# Patient Record
Sex: Male | Born: 1955 | Race: Black or African American | Hispanic: No | Marital: Married | Smoking: Never smoker
Health system: Southern US, Community
[De-identification: ages and names within clinical notes are randomized; demographics above are authoritative.]

## PROBLEM LIST (undated history)

## (undated) DIAGNOSIS — M722 Plantar fascial fibromatosis: Secondary | ICD-10-CM

## (undated) DIAGNOSIS — D649 Anemia, unspecified: Secondary | ICD-10-CM

## (undated) DIAGNOSIS — C801 Malignant (primary) neoplasm, unspecified: Secondary | ICD-10-CM

## (undated) DIAGNOSIS — N400 Enlarged prostate without lower urinary tract symptoms: Secondary | ICD-10-CM

## (undated) DIAGNOSIS — F329 Major depressive disorder, single episode, unspecified: Secondary | ICD-10-CM

## (undated) DIAGNOSIS — K219 Gastro-esophageal reflux disease without esophagitis: Secondary | ICD-10-CM

## (undated) DIAGNOSIS — T7840XA Allergy, unspecified, initial encounter: Secondary | ICD-10-CM

## (undated) DIAGNOSIS — M549 Dorsalgia, unspecified: Secondary | ICD-10-CM

## (undated) DIAGNOSIS — F32A Depression, unspecified: Secondary | ICD-10-CM

## (undated) DIAGNOSIS — R51 Headache: Secondary | ICD-10-CM

## (undated) DIAGNOSIS — M1990 Unspecified osteoarthritis, unspecified site: Secondary | ICD-10-CM

## (undated) DIAGNOSIS — R519 Headache, unspecified: Secondary | ICD-10-CM

## (undated) DIAGNOSIS — I10 Essential (primary) hypertension: Secondary | ICD-10-CM

## (undated) DIAGNOSIS — D72819 Decreased white blood cell count, unspecified: Secondary | ICD-10-CM

## (undated) DIAGNOSIS — D709 Neutropenia, unspecified: Secondary | ICD-10-CM

## (undated) HISTORY — PX: EYE SURGERY: SHX253

## (undated) HISTORY — DX: Benign prostatic hyperplasia without lower urinary tract symptoms: N40.0

## (undated) HISTORY — PX: HERNIA REPAIR: SHX51

## (undated) HISTORY — PX: FRACTURE SURGERY: SHX138

## (undated) HISTORY — PX: WRIST FRACTURE SURGERY: SHX121

## (undated) HISTORY — PX: INGUINAL EXPLORATION: SHX1826

## (undated) HISTORY — DX: Major depressive disorder, single episode, unspecified: F32.9

## (undated) HISTORY — DX: Depression, unspecified: F32.A

## (undated) HISTORY — DX: Neutropenia, unspecified: D70.9

## (undated) HISTORY — DX: Allergy, unspecified, initial encounter: T78.40XA

## (undated) HISTORY — DX: Essential (primary) hypertension: I10

## (undated) HISTORY — PX: KNEE SURGERY: SHX244

## (undated) HISTORY — DX: Gastro-esophageal reflux disease without esophagitis: K21.9

## (undated) HISTORY — DX: Decreased white blood cell count, unspecified: D72.819

---

## 1998-08-12 ENCOUNTER — Ambulatory Visit (HOSPITAL_BASED_OUTPATIENT_CLINIC_OR_DEPARTMENT_OTHER): Admission: RE | Admit: 1998-08-12 | Discharge: 1998-08-12 | Payer: Self-pay | Admitting: General Surgery

## 2005-03-29 ENCOUNTER — Encounter (INDEPENDENT_AMBULATORY_CARE_PROVIDER_SITE_OTHER): Payer: Self-pay | Admitting: Specialist

## 2005-03-29 ENCOUNTER — Ambulatory Visit (HOSPITAL_COMMUNITY): Admission: RE | Admit: 2005-03-29 | Discharge: 2005-03-29 | Payer: Self-pay | Admitting: Gastroenterology

## 2010-07-08 ENCOUNTER — Encounter
Admission: RE | Admit: 2010-07-08 | Discharge: 2010-07-08 | Payer: Self-pay | Source: Home / Self Care | Admitting: Orthopedic Surgery

## 2010-10-25 ENCOUNTER — Other Ambulatory Visit: Payer: Self-pay | Admitting: Orthopedic Surgery

## 2010-10-25 DIAGNOSIS — M25531 Pain in right wrist: Secondary | ICD-10-CM

## 2010-10-27 ENCOUNTER — Ambulatory Visit
Admission: RE | Admit: 2010-10-27 | Discharge: 2010-10-27 | Disposition: A | Discharge status: Home / Self Care | Payer: Managed Care, Other (non HMO) | Source: Ambulatory Visit | Attending: Orthopedic Surgery | Admitting: Orthopedic Surgery

## 2010-10-27 DIAGNOSIS — M25531 Pain in right wrist: Secondary | ICD-10-CM

## 2011-01-07 NOTE — Op Note (Signed)
NAME:  Brent Massey, Brent Massey NO.:  [id number]   MEDICAL RECORD NO.:  [id number]          PATIENT TYPE:  AMB   LOCATION:  ENDO                         FACILITY:  [facility]   PHYSICIAN:  Brent Massey, M.D.   DATE OF BIRTH:  Dec 24, 1955   DATE OF PROCEDURE:  03/29/2005  DATE OF DISCHARGE:                                 OPERATIVE REPORT   PROCEDURE:  Esophagogastroduodenoscopy, small bowel biopsy and colonoscopy.   PROCEDURE INDICATIONS:  Mr. Brent Massey is a 55 year old male born March 09, 1956. Mr. Brent Massey has unexplained iron deficiency anemia based on a  hemoglobin 10.1 grams, MCV 67, serum iron saturation 5% and normal serum B12  level.   Mr. Brent Massey mother has undergone colonoscopic exams in the past to remove  colon polyps. There is no family history of colon cancer. He denies  indigestion, heartburn or gastrointestinal bleeding.   For approximately four years,  Mr. Brent Massey has donated blood to the [employer] every eight weeks. He the has been rejected as a donor at least  once in the past due to low blood counts.   MEDICATIONS ALLERGIES:  None.   CHRONIC MEDICATIONS:  Flonase nasal spray, multivitamin, Saw Palmetto,  multivitamin supplements.   PAST MEDICAL - SURGICAL HISTORY:  Seasonal rhinitis, mildly elevated blood  pressure, benign prostatic hypertrophy, bilateral inguinal hernia repair,  wisdom teeth extraction.   FAMILY HISTORY:  Mother has had colon polyps. No family history of colon  cancer.   HABITS:  Mr. Brent Massey does not smoke cigarettes or consume alcohol.   ENDOSCOPIST:  Brent Massey, M.D.   PREMEDICATION:  Versed 7.5 mg, Demerol 70 mg.   DESCRIPTION OF PROCEDURE:  Esophagogastroduodenoscopy with small-bowel  biopsy. After obtaining informed consent, Mr. Brent Massey was placed in the left  lateral decubitus position. I administered intravenous Demerol and  intravenous Versed to achieve conscious sedation for the procedure. The  patient's blood pressure, oxygen saturation and cardiac rhythm were  monitored throughout the procedure and documented in the medical record.   The Olympus gastroscope was passed through the posterior hypopharynx into  the proximal esophagus without difficulty. The hypopharynx, larynx and vocal  cords appeared normal.   ESOPHAGOSCOPY:  The proximal, mid and lower segments of the esophageal  mucosa appear completely normal.   GASTROSCOPY:  Retroflex view of the gastric cardia and fundus was normal.  The gastric body, antrum and pylorus appeared normal.   DUODENOSCOPY:  The duodenal bulb, second portion of duodenum and third  portion of duodenum appeared normal.   BIOPSIES:  Four biopsies were taken from the second portion and third  portion of the duodenum to look for celiac sprue.   ASSESSMENT:  Normal esophagogastroduodenoscopy. Small bowel biopsies rule  out sprue pending.   PROCEDURE:  Proctocolonoscopy to the cecum.   DESCRIPTION OF PROCEDURE:  Anal inspection and digital rectal exam were  normal. The prostate was nonnodular. The Olympus adjustable pediatric  colonoscope was introduced into the rectum and advanced to the cecum. A  normal-appearing appendiceal orifice and ileocecal valve were identified.  Colonic preparation for the  exam today was excellent.   RECTUM:  Normal. Retroflex view of the distal rectum normal.  SIGMOID COLON AND DESCENDING COLON:  Normal.  SPLENIC FLEXURE:  Normal.  TRANSVERSE COLON:  Normal.  HEPATIC FLEXURE:  Normal.  ASCENDING COLON:  Normal.  CECUM AND ILEOCECAL VALVE:  Normal.   ASSESSMENT:  Normal screening proctocolonoscopy to the cecum. No endoscopic  evidence for the presence of colorectal neoplasia.   RECOMMENDATIONS:  Repeat colonoscopy in five years based on the fact that  his mother has had colon polyps removed in the past.       MJ/MEDQ  D:  03/29/2005  T:  03/29/2005  Job:  [id number]   cc:   Brent Housekeeper, MD  [address]  [place]  [place] [address]  Fax: [phone]

## 2011-06-03 ENCOUNTER — Encounter: Payer: Managed Care, Other (non HMO) | Admitting: Oncology

## 2011-06-10 ENCOUNTER — Ambulatory Visit (HOSPITAL_BASED_OUTPATIENT_CLINIC_OR_DEPARTMENT_OTHER): Payer: BC Managed Care – PPO | Admitting: Oncology

## 2011-06-10 ENCOUNTER — Other Ambulatory Visit: Payer: Self-pay | Admitting: Oncology

## 2011-06-10 DIAGNOSIS — D72819 Decreased white blood cell count, unspecified: Secondary | ICD-10-CM

## 2011-06-10 DIAGNOSIS — D709 Neutropenia, unspecified: Secondary | ICD-10-CM

## 2011-06-10 DIAGNOSIS — I10 Essential (primary) hypertension: Secondary | ICD-10-CM

## 2011-06-10 DIAGNOSIS — F329 Major depressive disorder, single episode, unspecified: Secondary | ICD-10-CM

## 2011-06-10 LAB — CBC WITH DIFFERENTIAL/PLATELET
BASO%: 0.7 % (ref 0.0–2.0)
Basophils Absolute: 0 10*3/uL (ref 0.0–0.1)
EOS%: 5.3 % (ref 0.0–7.0)
HCT: 39.2 % (ref 38.4–49.9)
LYMPH%: 46 % (ref 14.0–49.0)
MCH: 31.6 pg (ref 27.2–33.4)
MCHC: 33.4 g/dL (ref 32.0–36.0)
MCV: 94.5 fL (ref 79.3–98.0)
MONO%: 18.7 % — ABNORMAL HIGH (ref 0.0–14.0)
NEUT%: 29.3 % — ABNORMAL LOW (ref 39.0–75.0)
Platelets: 170 10*3/uL (ref 140–400)
lymph#: 0.7 10*3/uL — ABNORMAL LOW (ref 0.9–3.3)

## 2011-06-10 LAB — MORPHOLOGY: PLT EST: ADEQUATE

## 2011-06-10 LAB — CHCC SMEAR

## 2011-06-13 ENCOUNTER — Other Ambulatory Visit: Payer: Self-pay | Admitting: Oncology

## 2011-06-13 DIAGNOSIS — D72819 Decreased white blood cell count, unspecified: Secondary | ICD-10-CM

## 2011-06-13 LAB — COMPREHENSIVE METABOLIC PANEL
AST: 25 U/L (ref 0–37)
Albumin: 4 g/dL (ref 3.5–5.2)
Alkaline Phosphatase: 60 U/L (ref 39–117)
Potassium: 3.9 mEq/L (ref 3.5–5.3)
Sodium: 138 mEq/L (ref 135–145)
Total Bilirubin: 0.7 mg/dL (ref 0.3–1.2)
Total Protein: 6.8 g/dL (ref 6.0–8.3)

## 2011-06-13 LAB — URIC ACID: Uric Acid, Serum: 5.3 mg/dL (ref 4.0–7.8)

## 2011-06-20 ENCOUNTER — Other Ambulatory Visit: Payer: Self-pay | Admitting: Oncology

## 2011-06-20 ENCOUNTER — Other Ambulatory Visit: Payer: Self-pay | Admitting: Diagnostic Radiology

## 2011-06-20 ENCOUNTER — Ambulatory Visit (HOSPITAL_COMMUNITY): Payer: BC Managed Care – PPO

## 2011-06-20 ENCOUNTER — Ambulatory Visit (HOSPITAL_COMMUNITY)
Admission: RE | Admit: 2011-06-20 | Discharge: 2011-06-20 | Disposition: A | Discharge status: Home / Self Care | Payer: BC Managed Care – PPO | Source: Ambulatory Visit | Attending: Oncology | Admitting: Oncology

## 2011-06-20 DIAGNOSIS — F3289 Other specified depressive episodes: Secondary | ICD-10-CM | POA: Insufficient documentation

## 2011-06-20 DIAGNOSIS — D72819 Decreased white blood cell count, unspecified: Secondary | ICD-10-CM

## 2011-06-20 DIAGNOSIS — A6000 Herpesviral infection of urogenital system, unspecified: Secondary | ICD-10-CM | POA: Insufficient documentation

## 2011-06-20 DIAGNOSIS — F329 Major depressive disorder, single episode, unspecified: Secondary | ICD-10-CM | POA: Insufficient documentation

## 2011-06-20 DIAGNOSIS — K219 Gastro-esophageal reflux disease without esophagitis: Secondary | ICD-10-CM | POA: Insufficient documentation

## 2011-06-20 DIAGNOSIS — D61818 Other pancytopenia: Secondary | ICD-10-CM | POA: Insufficient documentation

## 2011-06-20 DIAGNOSIS — I10 Essential (primary) hypertension: Secondary | ICD-10-CM | POA: Insufficient documentation

## 2011-06-20 LAB — CBC
Hemoglobin: 13 g/dL (ref 13.0–17.0)
MCH: 32.7 pg (ref 26.0–34.0)
MCV: 96.2 fL (ref 78.0–100.0)
Platelets: 157 10*3/uL (ref 150–400)
RBC: 3.98 MIL/uL — ABNORMAL LOW (ref 4.22–5.81)
WBC: 1.6 10*3/uL — ABNORMAL LOW (ref 4.0–10.5)

## 2011-06-20 LAB — APTT: aPTT: 30 seconds (ref 24–37)

## 2011-06-20 LAB — PROTIME-INR: Prothrombin Time: 14.1 seconds (ref 11.6–15.2)

## 2011-06-20 LAB — BONE MARROW EXAM: Bone Marrow Exam: 817

## 2011-06-29 LAB — CHROMOSOME ANALYSIS, BONE MARROW

## 2011-07-05 ENCOUNTER — Encounter: Payer: Self-pay | Admitting: Oncology

## 2011-07-05 DIAGNOSIS — D709 Neutropenia, unspecified: Secondary | ICD-10-CM | POA: Insufficient documentation

## 2011-07-05 DIAGNOSIS — N400 Enlarged prostate without lower urinary tract symptoms: Secondary | ICD-10-CM | POA: Insufficient documentation

## 2011-07-05 DIAGNOSIS — F329 Major depressive disorder, single episode, unspecified: Secondary | ICD-10-CM | POA: Insufficient documentation

## 2011-07-05 DIAGNOSIS — F32A Depression, unspecified: Secondary | ICD-10-CM | POA: Insufficient documentation

## 2011-07-05 DIAGNOSIS — D72819 Decreased white blood cell count, unspecified: Secondary | ICD-10-CM | POA: Insufficient documentation

## 2011-07-05 DIAGNOSIS — K219 Gastro-esophageal reflux disease without esophagitis: Secondary | ICD-10-CM | POA: Insufficient documentation

## 2011-07-05 DIAGNOSIS — I10 Essential (primary) hypertension: Secondary | ICD-10-CM | POA: Insufficient documentation

## 2011-07-09 ENCOUNTER — Other Ambulatory Visit: Payer: Self-pay | Admitting: Oncology

## 2011-07-12 ENCOUNTER — Encounter: Payer: Self-pay | Admitting: *Deleted

## 2011-07-18 ENCOUNTER — Other Ambulatory Visit: Payer: Self-pay | Admitting: Oncology

## 2011-07-18 ENCOUNTER — Other Ambulatory Visit (HOSPITAL_BASED_OUTPATIENT_CLINIC_OR_DEPARTMENT_OTHER): Payer: BC Managed Care – PPO | Admitting: Lab

## 2011-07-18 ENCOUNTER — Ambulatory Visit (HOSPITAL_BASED_OUTPATIENT_CLINIC_OR_DEPARTMENT_OTHER): Payer: BC Managed Care – PPO | Admitting: Oncology

## 2011-07-18 ENCOUNTER — Other Ambulatory Visit: Payer: Self-pay | Admitting: *Deleted

## 2011-07-18 DIAGNOSIS — I10 Essential (primary) hypertension: Secondary | ICD-10-CM

## 2011-07-18 DIAGNOSIS — D72819 Decreased white blood cell count, unspecified: Secondary | ICD-10-CM

## 2011-07-18 DIAGNOSIS — K219 Gastro-esophageal reflux disease without esophagitis: Secondary | ICD-10-CM

## 2011-07-18 DIAGNOSIS — F329 Major depressive disorder, single episode, unspecified: Secondary | ICD-10-CM

## 2011-07-18 DIAGNOSIS — Z23 Encounter for immunization: Secondary | ICD-10-CM

## 2011-07-18 DIAGNOSIS — D709 Neutropenia, unspecified: Secondary | ICD-10-CM

## 2011-07-18 DIAGNOSIS — N400 Enlarged prostate without lower urinary tract symptoms: Secondary | ICD-10-CM

## 2011-07-18 DIAGNOSIS — F32A Depression, unspecified: Secondary | ICD-10-CM

## 2011-07-18 LAB — COMPREHENSIVE METABOLIC PANEL
Albumin: 4 g/dL (ref 3.5–5.2)
BUN: 15 mg/dL (ref 6–23)
CO2: 28 mEq/L (ref 19–32)
Calcium: 9.4 mg/dL (ref 8.4–10.5)
Chloride: 105 mEq/L (ref 96–112)
Glucose, Bld: 71 mg/dL (ref 70–99)
Potassium: 4.1 mEq/L (ref 3.5–5.3)
Sodium: 141 mEq/L (ref 135–145)
Total Protein: 6.5 g/dL (ref 6.0–8.3)

## 2011-07-18 LAB — CBC WITH DIFFERENTIAL/PLATELET
BASO%: 1.3 % (ref 0.0–2.0)
Eosinophils Absolute: 0.1 10*3/uL (ref 0.0–0.5)
HCT: 40.3 % (ref 38.4–49.9)
LYMPH%: 49 % (ref 14.0–49.0)
MCHC: 34 g/dL (ref 32.0–36.0)
MCV: 93.9 fL (ref 79.3–98.0)
MONO%: 17.4 % — ABNORMAL HIGH (ref 0.0–14.0)
NEUT%: 25.6 % — ABNORMAL LOW (ref 39.0–75.0)
Platelets: 170 10*3/uL (ref 140–400)
RBC: 4.29 10*6/uL (ref 4.20–5.82)
nRBC: 0 % (ref 0–0)

## 2011-07-18 LAB — MORPHOLOGY

## 2011-07-18 MED ORDER — INFLUENZA VIRUS VACC SPLIT PF IM SUSP
0.5000 mL | INTRAMUSCULAR | Status: AC | PRN
  Administered 2011-07-18: 0.5 mL via INTRAMUSCULAR

## 2011-07-18 NOTE — Progress Notes (Signed)
[facility] OFFICE PROGRESS NOTE  Brent,KARRAR, MD  DIAGNOSIS:  Leukocytopenia, neutropenia; not otherwise specified. Bone marrow biopsy on 06/22/2011 was negative including cytogenetics.  CURRENT THERAPY:  Watchful observation.  INTERVAL HISTORY: Brent Massey 55 y.o. male returns to clinic with his wife to go over the result of work up.  He still has no symptoms.  He still works full time.  He occasionally has some headache that is spontaneously resolved without any medication.  He denies confusion, visual changes, performance at works, anorexia, weight loss, focal motor weakness, problem driving, nausea vomiting, dysphagia, odynophagia, palpable lymph node swelling, chest pain, cough, hemoptysis, hematemesis, epistaxis, gum bleed, palpitation, abdominal pain, abdominal swelling, melena, hematochezia, hematuria, skin rash, low back pain, bowel or bladder incontinence.  MEDICAL HISTORY: Past Medical History  Diagnosis Date  . Leukocytopenia   . Depression   . Hypertension   . BPH (benign prostatic hyperplasia)   . GERD (gastroesophageal reflux disease)   . Neutropenia, unspecified     SURGICAL HISTORY: No past surgical history on file.  MEDICATIONS: Current Outpatient Prescriptions  Medication Sig Dispense Refill  . escitalopram (LEXAPRO) 10 MG tablet Take 10 mg by mouth daily.        . fish oil-omega-3 fatty acids 1000 MG capsule Take 1 g by mouth daily.        Marland Kitchen loratadine (CLARITIN) 10 MG tablet Take 10 mg by mouth daily as needed.       Marland Kitchen losartan-hydrochlorothiazide (HYZAAR) 100-25 MG per tablet Take 1 tablet by mouth daily.        . mometasone (NASONEX) 50 MCG/ACT nasal spray Place 2 sprays into the nose daily.        . Multiple Vitamin (MULTIVITAMIN) tablet Take 1 tablet by mouth daily.        . Probiotic Product (PROBIOTIC FORMULA) CAPS Take 1 capsule by mouth daily. 3.4 Billion       . Saw Palmetto, Serenoa repens, (SAW PALMETTO PO) Take by mouth daily.          No current facility-administered medications for this visit.   Facility-Administered Medications Ordered in Other Visits  Medication Dose Route Frequency Provider Last Rate Last Dose  . influenza  inactive virus vaccine (FLUZONE/FLUARIX) injection 0.5 mL  0.5 mL Intramuscular Prior to discharge Jethro Bolus, MD   0.5 mL at 07/18/11 1348    ALLERGIES:   has no known allergies.  REVIEW OF SYSTEMS:  The rest of the 14-point review of system was negative.   Filed Vitals:   07/18/11 1317  BP: 127/73  Pulse: 61  Temp: 97 F (36.1 C)   Wt Readings from Last 3 Encounters:  07/18/11 189 lb 12.8 oz (86.093 kg)  06/10/11 187 lb 6.4 oz (85.004 kg)   ECOG Performance status: 0  PHYSICAL EXAMINATION:   General:  well-nourished in no acute distress.  Eyes:  no scleral icterus.  ENT:  There were no oropharyngeal lesions.  Neck was without thyromegaly.  Lymphatics:  Negative cervical, supraclavicular or axillary adenopathy.  Respiratory: lungs were clear bilaterally without wheezing or crackles.  Cardiovascular:  Regular rate and rhythm, S1/S2, without murmur, rub or gallop.  There was no pedal edema.  GI:  abdomen was soft, flat, nontender, nondistended, without organomegaly.  Muscoloskeletal:  no spinal tenderness of palpation of vertebral spine.  Skin exam was without echymosis, petichae.  Neuro exam was nonfocal.  Patient was able to get on and off exam table without assistance.  Gait was normal.  Patient was alerted and oriented.  Attention was good.   Language was appropriate.  Mood was normal without depression.  Speech was not pressured.  Thought content was not tangential.     LABORATORY/RADIOLOGY DATA:  Lab Results  Component Value Date   WBC 1.5* 07/18/2011   HGB 13.7 07/18/2011   HCT 40.3 07/18/2011   PLT 170 07/18/2011   GLUCOSE 71 07/18/2011   ALT 21 07/18/2011   AST 27 07/18/2011   NA 141 07/18/2011   K 4.1 07/18/2011   CL 105 07/18/2011   CREATININE 1.07 07/18/2011   BUN 15  07/18/2011   CO2 28 07/18/2011   INR 1.07 06/20/2011    I personally reviewed the patient's peripheral blood smear today.  There was isocytosis.  There was no peripheral blast.  There was no schistocytosis, spherocytosis, target cell, rouleaux formation, tear drop cell.  There was no giant platelets or platelet clumps.     ASSESSMENT AND PLAN:   1.  Leukocytopenia/Neutropenia, NOS:  I discussed with Brent Massey and his wife that they be biopsy showed he had normal cellularity and trilineage hematopoiesis. His cytogenetics was normal.  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  I discussed with him that he does not have recurrent infection and therefore Neulasta is not indicated.  I advised him to watch out for recurrent infection symptoms and if that is the case, I may see him again to start Neulasta to decrease her risk of recurrent infection.  For now watchful observation is recommended.  The patient and his wife expressed informed understanding and agreed with the stated plan.  I cannot rule out the possibility of Lexapro inducing cytopenia; however, medications are not known to cause this level of cytopenia.  2.  Hypertension: Well controlled on losartan/HCTZ per PCP.  3.  Depression:  well-controlled on citalopram for many years per PCP.   4.  Follow up:  Lab only in 2 months, and in 4 months.  I will see him in 6 months.   5.  Primary care: I discussed with him and his wife the risk and benefit of influenza vaccination. He expressed informed understanding and wished to proceed with that today.

## 2011-09-16 ENCOUNTER — Telehealth: Payer: Self-pay | Admitting: *Deleted

## 2011-09-16 ENCOUNTER — Other Ambulatory Visit (HOSPITAL_BASED_OUTPATIENT_CLINIC_OR_DEPARTMENT_OTHER): Payer: BC Managed Care – PPO

## 2011-09-16 DIAGNOSIS — D72819 Decreased white blood cell count, unspecified: Secondary | ICD-10-CM

## 2011-09-16 LAB — CBC WITH DIFFERENTIAL/PLATELET
Basophils Absolute: 0 10*3/uL (ref 0.0–0.1)
Eosinophils Absolute: 0.2 10*3/uL (ref 0.0–0.5)
HGB: 13.8 g/dL (ref 13.0–17.1)
MCV: 93.3 fL (ref 79.3–98.0)
MONO%: 16 % — ABNORMAL HIGH (ref 0.0–14.0)
NEUT#: 0.4 10*3/uL — CL (ref 1.5–6.5)
RBC: 4.36 10*6/uL (ref 4.20–5.82)
RDW: 13 % (ref 11.0–14.6)
WBC: 1.7 10*3/uL — ABNORMAL LOW (ref 4.0–10.3)
lymph#: 0.9 10*3/uL (ref 0.9–3.3)
nRBC: 0 % (ref 0–0)

## 2011-09-16 NOTE — Telephone Encounter (Signed)
Pt called back for results of his CBC and I relayed his results and Dr. Lodema Pilot message that his WBC and neutrophil count is still low, but stable.  Reviewed precautions w/ pt as far as good handwashing,  Avoiding anyone known to be sick and calling his PCP for any fevers or signs of infection. Pt verbalized understanding and next lab appt in March.

## 2011-09-16 NOTE — Telephone Encounter (Signed)
Called pt's cell # and no answer and no voice mail avail..  Called home # and left VM for pt to return call (need to relay message from Dr. Gaylyn Rong regarding pt's CBC results stable).

## 2011-09-16 NOTE — Telephone Encounter (Signed)
Message copied by Wende Mott on Fri Sep 16, 2011  1:07 PM ------      Message from: Jethro Bolus T      Created: Fri Sep 16, 2011 11:10 AM       Please call pt.  His WBC and neutrophil are still low; however, stable.  Continue observation.  Thanks.

## 2011-11-09 ENCOUNTER — Other Ambulatory Visit: Payer: BC Managed Care – PPO | Admitting: Lab

## 2011-11-11 ENCOUNTER — Other Ambulatory Visit: Payer: BC Managed Care – PPO | Admitting: Lab

## 2011-11-17 ENCOUNTER — Other Ambulatory Visit (HOSPITAL_BASED_OUTPATIENT_CLINIC_OR_DEPARTMENT_OTHER): Payer: BC Managed Care – PPO | Admitting: Lab

## 2011-11-17 ENCOUNTER — Telehealth: Payer: Self-pay | Admitting: *Deleted

## 2011-11-17 DIAGNOSIS — D72819 Decreased white blood cell count, unspecified: Secondary | ICD-10-CM

## 2011-11-17 LAB — CBC WITH DIFFERENTIAL/PLATELET
BASO%: 1 % (ref 0.0–2.0)
Eosinophils Absolute: 0.1 10*3/uL (ref 0.0–0.5)
HCT: 40.8 % (ref 38.4–49.9)
MCHC: 33.6 g/dL (ref 32.0–36.0)
MONO#: 0.3 10*3/uL (ref 0.1–0.9)
NEUT#: 0.8 10*3/uL — ABNORMAL LOW (ref 1.5–6.5)
NEUT%: 40.4 % (ref 39.0–75.0)
Platelets: 165 10*3/uL (ref 140–400)
RBC: 4.32 10*6/uL (ref 4.20–5.82)
WBC: 1.9 10*3/uL — ABNORMAL LOW (ref 4.0–10.3)
lymph#: 0.8 10*3/uL — ABNORMAL LOW (ref 0.9–3.3)
nRBC: 0 % (ref 0–0)

## 2011-11-17 NOTE — Telephone Encounter (Signed)
LEFT MESSAGE FOR A RETURN CALL. 

## 2011-11-17 NOTE — Telephone Encounter (Signed)
Message copied by Arvilla Meres on Thu Nov 17, 2011 12:05 PM ------      Message from: HA, Raliegh Ip T      Created: Thu Nov 17, 2011 11:39 AM       Please call pt.  He still has low WBC and neutrophil; but these are slightly better than before.  We will continue watchful observation.  Thanks.

## 2011-11-17 NOTE — Telephone Encounter (Signed)
PT. RETURNED CALL. INFORMED PT. THAT HIS WBC AND NEUTROPHIL ARE STILL LOW, BUT THESE ARE SLIGHTLY BETTER THAN BEFORE. WE WILL CONTINUE WATCHFUL OBSERVATION. PT. VOICES UNDERSTANDING.

## 2012-01-20 ENCOUNTER — Telehealth: Payer: Self-pay | Admitting: Oncology

## 2012-01-20 ENCOUNTER — Ambulatory Visit (HOSPITAL_BASED_OUTPATIENT_CLINIC_OR_DEPARTMENT_OTHER): Payer: BC Managed Care – PPO | Admitting: Oncology

## 2012-01-20 ENCOUNTER — Ambulatory Visit: Payer: BC Managed Care – PPO | Admitting: Oncology

## 2012-01-20 ENCOUNTER — Encounter: Payer: Self-pay | Admitting: Oncology

## 2012-01-20 ENCOUNTER — Other Ambulatory Visit (HOSPITAL_BASED_OUTPATIENT_CLINIC_OR_DEPARTMENT_OTHER): Payer: BC Managed Care – PPO | Admitting: Lab

## 2012-01-20 ENCOUNTER — Other Ambulatory Visit: Payer: BC Managed Care – PPO | Admitting: Lab

## 2012-01-20 VITALS — BP 128/81 | HR 60 | Temp 97.1°F | Ht 74.0 in | Wt 194.0 lb

## 2012-01-20 DIAGNOSIS — D709 Neutropenia, unspecified: Secondary | ICD-10-CM

## 2012-01-20 DIAGNOSIS — D72819 Decreased white blood cell count, unspecified: Secondary | ICD-10-CM

## 2012-01-20 LAB — CBC WITH DIFFERENTIAL/PLATELET
BASO%: 0.7 % (ref 0.0–2.0)
Basophils Absolute: 0 10*3/uL (ref 0.0–0.1)
Eosinophils Absolute: 0.1 10*3/uL (ref 0.0–0.5)
HCT: 40.8 % (ref 38.4–49.9)
HGB: 13.6 g/dL (ref 13.0–17.1)
LYMPH%: 49.8 % — ABNORMAL HIGH (ref 14.0–49.0)
MONO#: 0.3 10*3/uL (ref 0.1–0.9)
NEUT#: 0.3 10*3/uL — CL (ref 1.5–6.5)
NEUT%: 22.2 % — ABNORMAL LOW (ref 39.0–75.0)
Platelets: 169 10*3/uL (ref 140–400)
WBC: 1.5 10*3/uL — ABNORMAL LOW (ref 4.0–10.3)
lymph#: 0.8 10*3/uL — ABNORMAL LOW (ref 0.9–3.3)

## 2012-01-20 NOTE — Progress Notes (Signed)
[facility] OFFICE PROGRESS NOTE  Brent Housekeeper, MD, MD  DIAGNOSIS:  Leukocytopenia, neutropenia; not otherwise specified. Bone marrow biopsy on 06/22/2011 was negative including cytogenetics.  CURRENT THERAPY:  Watchful observation.  INTERVAL HISTORY: Brent Massey 56 y.o. male returns to clinic by himself for follow-up.  He still has no symptoms.  He still works full time.  He occasionally has some headache that is spontaneously resolved without any medication.  He denies confusion, visual changes, anorexia, weight loss, focal motor weakness, problem driving, nausea vomiting, dysphagia, odynophagia, palpable lymph node swelling, chest pain, cough, hemoptysis, hematemesis, epistaxis, gum bleed, palpitation, abdominal pain, abdominal swelling, melena, hematochezia, hematuria, skin rash, low back pain, bowel or bladder incontinence. No recent infections or fevers.  MEDICAL HISTORY: Past Medical History  Diagnosis Date  . Leukocytopenia   . Depression   . Hypertension   . BPH (benign prostatic hyperplasia)   . GERD (gastroesophageal reflux disease)   . Neutropenia, unspecified     SURGICAL HISTORY: History reviewed. No pertinent past surgical history.  MEDICATIONS: Current Outpatient Prescriptions  Medication Sig Dispense Refill  . escitalopram (LEXAPRO) 10 MG tablet Take 10 mg by mouth daily.        . fish oil-omega-3 fatty acids 1000 MG capsule Take 1 g by mouth daily.        Marland Kitchen loratadine (CLARITIN) 10 MG tablet Take 10 mg by mouth daily as needed.       Marland Kitchen losartan-hydrochlorothiazide (HYZAAR) 100-25 MG per tablet Take 1 tablet by mouth daily.        . mometasone (NASONEX) 50 MCG/ACT nasal spray Place 2 sprays into the nose daily.        . Multiple Vitamin (MULTIVITAMIN) tablet Take 1 tablet by mouth daily.        . Probiotic Product (PROBIOTIC FORMULA) CAPS Take 1 capsule by mouth daily. 3.4 Billion       . Saw Palmetto, Serenoa repens, (SAW PALMETTO PO) Take by mouth  daily.          ALLERGIES:   has no known allergies.  REVIEW OF SYSTEMS:  The rest of the 14-point review of system was negative.   There were no vitals filed for this visit. Wt Readings from Last 3 Encounters:  07/18/11 189 lb 12.8 oz (86.093 kg)  06/10/11 187 lb 6.4 oz (85.004 kg)   ECOG Performance status: 0  PHYSICAL EXAMINATION:   General:  well-nourished in no acute distress.  Eyes:  no scleral icterus.  ENT:  There were no oropharyngeal lesions.  Neck was without thyromegaly.  Lymphatics:  Negative cervical, supraclavicular or axillary adenopathy.  Respiratory: lungs were clear bilaterally without wheezing or crackles.  Cardiovascular:  Regular rate and rhythm, S1/S2, without murmur, rub or gallop.  There was no pedal edema.  GI:  abdomen was soft, flat, nontender, nondistended, without organomegaly.  Muscoloskeletal:  no spinal tenderness of palpation of vertebral spine.  Skin exam was without echymosis, petichae.  Neuro exam was nonfocal.  Patient was able to get on and off exam table without assistance.  Gait was normal.  Patient was alerted and oriented.  Attention was good.   Language was appropriate.  Mood was normal without depression.  Speech was not pressured.  Thought content was not tangential.     LABORATORY/RADIOLOGY DATA:  Lab Results  Component Value Date   WBC 1.5* 01/20/2012   HGB 13.6 01/20/2012   HCT 40.8 01/20/2012   PLT 169 01/20/2012   GLUCOSE 71 07/18/2011  ALT 21 07/18/2011   AST 27 07/18/2011   NA 141 07/18/2011   K 4.1 07/18/2011   CL 105 07/18/2011   CREATININE 1.07 07/18/2011   BUN 15 07/18/2011   CO2 28 07/18/2011   INR 1.07 06/20/2011    ASSESSMENT AND PLAN:   1.  Leukocytopenia/Neutropenia, NOS:  Previous bone marrow biopsy showed he had normal cellularity and trilineage hematopoiesis. His cytogenetics was normal.  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  I discussed with him that he does not have recurrent infection  and therefore Neulasta is not indicated.  I advised him to watch out for recurrent infection symptoms and if that is the case, I may see him again to start Neulasta to decrease her risk of recurrent infection.  For now watchful observation is recommended.  The patient expressed informed understanding and agreed with the stated plan.  I cannot rule out the possibility of Lexapro inducing cytopenia; however, medications are not known to cause this level of cytopenia.  2.  Hypertension: Well controlled on losartan/HCTZ per PCP.  3.  Depression:  well-controlled on Lexapr for many years per PCP.   4.  Follow up:  Lab only in 2 months, and in 4 months. He will have a visit with Dr Gaylyn Rong in 6 months.

## 2012-01-20 NOTE — Telephone Encounter (Signed)
Gv pt appt for [id number] 

## 2012-01-21 LAB — COMPREHENSIVE METABOLIC PANEL
BUN: 14 mg/dL (ref 6–23)
CO2: 30 mEq/L (ref 19–32)
Calcium: 8.7 mg/dL (ref 8.4–10.5)
Chloride: 104 mEq/L (ref 96–112)
Creatinine, Ser: 1.06 mg/dL (ref 0.50–1.35)
Total Bilirubin: 0.6 mg/dL (ref 0.3–1.2)

## 2012-01-25 ENCOUNTER — Ambulatory Visit (INDEPENDENT_AMBULATORY_CARE_PROVIDER_SITE_OTHER): Payer: BC Managed Care – PPO | Admitting: Physician Assistant

## 2012-01-25 ENCOUNTER — Encounter: Payer: Self-pay | Admitting: Physician Assistant

## 2012-01-25 VITALS — BP 116/72 | HR 57 | Temp 97.8°F | Resp 16 | Ht 75.0 in | Wt 194.0 lb

## 2012-01-25 DIAGNOSIS — L255 Unspecified contact dermatitis due to plants, except food: Secondary | ICD-10-CM

## 2012-01-25 DIAGNOSIS — L299 Pruritus, unspecified: Secondary | ICD-10-CM

## 2012-01-25 DIAGNOSIS — L237 Allergic contact dermatitis due to plants, except food: Secondary | ICD-10-CM

## 2012-01-25 MED ORDER — TRIAMCINOLONE ACETONIDE 0.1 % EX CREA: TOPICAL_CREAM | CUTANEOUS | Status: DC

## 2012-01-25 MED ORDER — PREDNISONE 20 MG PO TABS: ORAL_TABLET | ORAL | Status: AC

## 2012-01-25 NOTE — Progress Notes (Signed)
Patient ID: Brent Massey MRN: [id number], DOB: [birth year], 56 y.o. Date of Encounter: 01/25/2012, 5:47 PM  Primary Physician: Georgann Housekeeper, MD, MD  Chief Complaint: Pruritic rash  HPI: 56 y.o. year old male with presents with a five day history of mildly erythematous pruritic rash along right forearm. Patient was doing yard work prior to the development of the rash. Known poison ivy in the vicinity. Has not yet washed all clothing or linens that have been exposed. Has tried OTC creams without success. Currently under evaluation for leukopenia of uncertain etiology. Patient is otherwise doing well without issues or complaints.  Past Medical History  Diagnosis Date  . Leukocytopenia   . Depression   . Hypertension   . BPH (benign prostatic hyperplasia)   . GERD (gastroesophageal reflux disease)   . Neutropenia, unspecified      Home Meds: Prior to Admission medications   Medication Sig Start Date End Date Taking? Authorizing Provider  escitalopram (LEXAPRO) 10 MG tablet Take 10 mg by mouth daily.      Historical Provider, MD  fish oil-omega-3 fatty acids 1000 MG capsule Take 1 g by mouth daily.      Historical Provider, MD  loratadine (CLARITIN) 10 MG tablet Take 10 mg by mouth daily as needed.     Historical Provider, MD  losartan-hydrochlorothiazide (HYZAAR) 100-25 MG per tablet Take 1 tablet by mouth daily.      Historical Provider, MD  mometasone (NASONEX) 50 MCG/ACT nasal spray Place 2 sprays into the nose daily.      Historical Provider, MD  Multiple Vitamin (MULTIVITAMIN) tablet Take 1 tablet by mouth daily.      Historical Provider, MD         Probiotic Product (PROBIOTIC FORMULA) CAPS Take 1 capsule by mouth daily. 3.4 Billion     Historical Provider, MD  Saw Palmetto, Serenoa repens, (SAW PALMETTO PO) Take by mouth daily.      Historical Provider, MD           Allergies: No Known Allergies  History   Social History  . Marital Status: Married    Spouse Name: N/A   Number of Children: N/A  . Years of Education: N/A   Occupational History  . Not on file.   Social History Main Topics  . Smoking status: Never Smoker   . Smokeless tobacco: Not on file  . Alcohol Use: Not on file  . Drug Use: Not on file  . Sexually Active: Not on file   Other Topics Concern  . Not on file   Social History Narrative  . No narrative on file     Review of Systems: Constitutional: negative for chills, fever, night sweats, weight changes, or fatigue  HEENT: negative for vision changes, hearing loss, congestion, rhinorrhea, ST, epistaxis, or sinus pressure Cardiovascular: negative for chest pain or palpitations Respiratory: negative for hemoptysis, wheezing, shortness of breath, or cough Abdominal: negative for abdominal pain, nausea, vomiting, diarrhea, or constipation Dermatological: see above Neurologic: negative for headache, dizziness, or syncope   Physical Exam: Blood pressure 116/72, pulse 57, temperature 97.8 F (36.6 C), temperature source Oral, resp. rate 16, height 6\' 3"  (1.905 m), weight 194 lb (87.998 kg), SpO2 99.00%., Body mass index is 24.25 kg/(m^2). General: Well developed, well nourished, in no acute distress. Head: Normocephalic, atraumatic, eyes without discharge, sclera non-icteric, nares are without discharge.  Neck: Supple. No thyromegaly. Full ROM. No lymphadenopathy. Lungs: Clear bilaterally to auscultation without wheezes, rales, or rhonchi. Breathing is unlabored.  Heart: RRR with S1 S2. No murmurs, rubs, or gallops appreciated. Msk:  Strength and tone normal for age. Extremities/Skin: Warm and dry. Multiple vesicular lesions along right forearm consistent with poison ivy. No secondary infections present. No clubbing or cyanosis. No edema.  Neuro: Alert and oriented X 3. Moves all extremities spontaneously. Gait is normal. CNII-XII grossly in tact. Psych:  Responds to questions appropriately with a normal affect.      ASSESSMENT  AND PLAN:  56 y.o. year old male with poison ivy -Prednisone 20 mg #12 3x2, 2x2, 1x2 no RF -Triamcinolone topical cream 0.1% Apply to affected area bid #45 grams RF 4 -Attempt to avoid scratching to decrease possibility of secondary infection and leukopenia -Discussed with Dr. Milus Glazier [facility] all contaminated clothes and linens -RTC 10 days if symptoms persist, sooner if they worsen   Signed, Eula Listen, PA-C 01/25/2012 5:47 PM

## 2012-03-21 ENCOUNTER — Other Ambulatory Visit (HOSPITAL_BASED_OUTPATIENT_CLINIC_OR_DEPARTMENT_OTHER): Payer: BC Managed Care – PPO | Admitting: Lab

## 2012-03-21 DIAGNOSIS — D709 Neutropenia, unspecified: Secondary | ICD-10-CM

## 2012-03-21 DIAGNOSIS — D72819 Decreased white blood cell count, unspecified: Secondary | ICD-10-CM

## 2012-03-21 LAB — CBC WITH DIFFERENTIAL/PLATELET
Basophils Absolute: 0 10*3/uL (ref 0.0–0.1)
Eosinophils Absolute: 0.2 10*3/uL (ref 0.0–0.5)
HCT: 43 % (ref 38.4–49.9)
HGB: 14.3 g/dL (ref 13.0–17.1)
MCV: 96.3 fL (ref 79.3–98.0)
MONO%: 14.7 % — ABNORMAL HIGH (ref 0.0–14.0)
NEUT#: 0.6 10*3/uL — ABNORMAL LOW (ref 1.5–6.5)
NEUT%: 31 % — ABNORMAL LOW (ref 39.0–75.0)
Platelets: 164 10*3/uL (ref 140–400)
RDW: 13 % (ref 11.0–14.6)

## 2012-03-26 ENCOUNTER — Telehealth: Payer: Self-pay | Admitting: *Deleted

## 2012-03-26 NOTE — Telephone Encounter (Signed)
Called pt w/ results of CBC, WBC and Neuts stable per Dr. Gaylyn Rong.  Pt denies any fevers or s/s of infections.  Instructed to continue observation,  Call if any recurrent infections..  Pt verbalized understanding.

## 2012-03-26 NOTE — Telephone Encounter (Signed)
Message copied by Wende Mott on Mon Mar 26, 2012 11:43 AM ------      Message from: Clenton Pare R      Created: Mon Mar 26, 2012  8:56 AM       Please call patient and let him know that his CBC remains stable. Continue follow-up as scheduled with [place].            Thanks

## 2012-04-02 ENCOUNTER — Encounter (HOSPITAL_COMMUNITY): Payer: Self-pay | Admitting: Emergency Medicine

## 2012-04-02 ENCOUNTER — Emergency Department (HOSPITAL_COMMUNITY)
Admission: EM | Admit: 2012-04-02 | Discharge: 2012-04-02 | Disposition: A | Discharge status: Home / Self Care | Payer: BC Managed Care – PPO | Attending: Emergency Medicine | Admitting: Emergency Medicine

## 2012-04-02 DIAGNOSIS — K219 Gastro-esophageal reflux disease without esophagitis: Secondary | ICD-10-CM | POA: Insufficient documentation

## 2012-04-02 DIAGNOSIS — I10 Essential (primary) hypertension: Secondary | ICD-10-CM | POA: Insufficient documentation

## 2012-04-02 DIAGNOSIS — R109 Unspecified abdominal pain: Secondary | ICD-10-CM | POA: Insufficient documentation

## 2012-04-02 DIAGNOSIS — R197 Diarrhea, unspecified: Secondary | ICD-10-CM | POA: Insufficient documentation

## 2012-04-02 DIAGNOSIS — N400 Enlarged prostate without lower urinary tract symptoms: Secondary | ICD-10-CM | POA: Insufficient documentation

## 2012-04-02 NOTE — ED Notes (Signed)
Received pt by EMS with c/o mid abdominal pain with 1 episode of diarrhea with diaphoresis. Pt denies pain at present. Pt describes pain as muscle cramping. Abdomen soft, non tender. Pt denies N/V.

## 2012-04-02 NOTE — ED Provider Notes (Signed)
History  This chart was scribed for Suzi Roots, MD by Bennett Scrape. This patient was seen in room A10C/A10C and the patient's care was started at 8:27AM.  CSN: [id number]  Arrival date & time 04/02/12  2952   First MD Initiated Contact with Patient 04/02/12 0827      Chief Complaint  Patient presents with  . Abdominal Pain  . Diarrhea    Patient is a 56 y.o. male presenting with abdominal pain. The history is provided by the patient. No language interpreter was used.  Abdominal Pain The primary symptoms of the illness include abdominal pain and diarrhea. The primary symptoms of the illness do not include fever, shortness of breath, nausea or vomiting. The current episode started less than 1 hour ago.  The abdominal pain is located in the epigastric region. The abdominal pain does not radiate.  Additional symptoms associated with the illness include diaphoresis. Symptoms associated with the illness do not include chills. Significant associated medical issues include GERD. Significant associated medical issues do not include diabetes or gallstones.      Brent Massey is a 56 y.o. male brought in by ambulance, who presents to the Emergency Department complaining of one hour of sudden onset, constant, non-radiating epigastric abdominal pain described as cramping with 2 associated episodes of diarrhea described as watery, diaphoresis, and light-headedness that started while he was at work. Pt reports that his symptoms have been gradually improving since EMS arrival to his work. He denies having any modifying factors and did not take any OTC medications prior to EMS arrival to his work. He denies any possible suspicious food intake stating that he ate a bowl of cereal and banana prior to the symptoms onset. He denies having a h/o gallstones, pud,  or kidney stones. He denies CP, fever, nausea, emesis, SOB and cough as associated symptoms. He has a h/o leukocytoopenia and states that he was  evaluated at [facility] for HIV, lymphoma and several other possible causes, which were all negative. He also has a h/o HTN, GERD and depression. He denies smoking and alcohol use.  Pt denies any cp or discomfort. No palpitations. No sob. No known bad food ingestion or ill contacts.    Past Medical History  Diagnosis Date  . Leukocytopenia   . Depression   . Hypertension   . BPH (benign prostatic hyperplasia)   . GERD (gastroesophageal reflux disease)   . Neutropenia, unspecified     Past Surgical History  Procedure Date  . Wrist fracture surgery   . Hernia repair-double inguinal      No family history on file.  History  Substance Use Topics  . Smoking status: Never Smoker   . Smokeless tobacco: Not on file  . Alcohol Use: No      Review of Systems  Constitutional: Positive for diaphoresis. Negative for fever and chills.  HENT: Negative for congestion and sore throat.   Respiratory: Negative for cough and shortness of breath.   Cardiovascular: Negative for chest pain.  Gastrointestinal: Positive for abdominal pain and diarrhea. Negative for nausea and vomiting.  Neurological: Positive for light-headedness. Negative for headaches.  All other systems reviewed and are negative.    Allergies  Review of patient's allergies indicates no known allergies.  Home Medications   Current Outpatient Rx  Name Route Sig Dispense Refill  . ESCITALOPRAM OXALATE 10 MG PO TABS Oral Take 10 mg by mouth daily.      . OMEGA-3 FATTY ACIDS 1000 MG PO  CAPS Oral Take 1 g by mouth daily.      Marland Kitchen LORATADINE 10 MG PO TABS Oral Take 10 mg by mouth daily as needed.     Marland Kitchen LOSARTAN POTASSIUM-HCTZ 100-25 MG PO TABS Oral Take 1 tablet by mouth daily.      . MOMETASONE FUROATE 50 MCG/ACT NA SUSP Nasal Place 2 sprays into the nose daily.      Marland Kitchen ONE-DAILY MULTI VITAMINS PO TABS Oral Take 1 tablet by mouth daily.      Marland Kitchen PROBIOTIC FORMULA PO CAPS Oral Take 1 capsule by mouth daily. 3.4 Billion     .  SAW PALMETTO PO Oral Take by mouth daily.      . TRIAMCINOLONE ACETONIDE 0.1 % EX CREA  APPLY TO AFFECTED AREA BID 45 g 4    Triage Vitals: BP 112/71  Pulse 57  Temp 96.8 F (36 C) (Oral)  Resp 16  SpO2 100%  Physical Exam  Nursing note and vitals reviewed. Constitutional: He is oriented to person, place, and time. He appears well-developed and well-nourished. No distress.  HENT:  Head: Normocephalic and atraumatic.  Mouth/Throat: Oropharynx is clear and moist.  Eyes: Conjunctivae and EOM are normal.  Neck: Neck supple. No tracheal deviation present.  Cardiovascular: Normal rate, regular rhythm, normal heart sounds and intact distal pulses.  Exam reveals no gallop and no friction rub.   No murmur heard. Pulmonary/Chest: Effort normal and breath sounds normal. No respiratory distress.  Abdominal: Soft. Bowel sounds are normal. He exhibits no distension and no mass. There is no tenderness. There is no rebound and no guarding.  Genitourinary:       No cva tenderness  Musculoskeletal: Normal range of motion. He exhibits no edema.  Neurological: He is alert and oriented to person, place, and time.  Skin: Skin is warm and dry.  Psychiatric: He has a normal mood and affect. His behavior is normal.    ED Course  Procedures (including critical care time)  DIAGNOSTIC STUDIES: Oxygen Saturation is 100% on room air, normal by my interpretation.    COORDINATION OF CARE: 8:32AM-Discussed treatment plan which includes a PO challenge with pt at bedside and pt agreed to plan.     MDM  I personally performed the services described in this documentation, which was scribed in my presence. The recorded information has been reviewed and considered. Suzi Roots, MD   Pt c/o onset 2 diarrhea/watery stools and mid abd cramping earlier this morning. ?transient vagal symptoms w gi symptoms/diarrhea. Symptoms have completely resolved. abd soft nt. No nvd in ed. Tolerating po fluids.            Suzi Roots, MD 04/02/12 0900

## 2012-04-29 NOTE — Progress Notes (Signed)
Not applicable.  Before EPIC gone live.  

## 2012-05-21 ENCOUNTER — Other Ambulatory Visit (HOSPITAL_BASED_OUTPATIENT_CLINIC_OR_DEPARTMENT_OTHER): Payer: BC Managed Care – PPO | Admitting: Lab

## 2012-05-21 DIAGNOSIS — D72819 Decreased white blood cell count, unspecified: Secondary | ICD-10-CM

## 2012-05-21 DIAGNOSIS — D709 Neutropenia, unspecified: Secondary | ICD-10-CM

## 2012-05-21 LAB — CBC WITH DIFFERENTIAL/PLATELET
BASO%: 1.2 % (ref 0.0–2.0)
EOS%: 8 % — ABNORMAL HIGH (ref 0.0–7.0)
LYMPH%: 41 % (ref 14.0–49.0)
MCHC: 33.4 g/dL (ref 32.0–36.0)
MCV: 96.1 fL (ref 79.3–98.0)
MONO%: 16.9 % — ABNORMAL HIGH (ref 0.0–14.0)
Platelets: 159 10*3/uL (ref 140–400)
RBC: 4.27 10*6/uL (ref 4.20–5.82)
RDW: 13.2 % (ref 11.0–14.6)
WBC: 1.7 10*3/uL — ABNORMAL LOW (ref 4.0–10.3)

## 2012-05-23 ENCOUNTER — Telehealth: Payer: Self-pay

## 2012-05-23 NOTE — Telephone Encounter (Signed)
Message copied by Kallie Locks on Wed May 23, 2012  8:40 AM ------      Message from: Myrtis Ser      Created: Tue May 22, 2012 11:24 AM       Please call patient and let him know that his WBC remains stable. Hemoglobin and Platelets are normal. Recommend continued observation. Keep appt in November.            Thanks

## 2012-07-20 ENCOUNTER — Ambulatory Visit (HOSPITAL_BASED_OUTPATIENT_CLINIC_OR_DEPARTMENT_OTHER): Payer: BC Managed Care – PPO | Admitting: Oncology

## 2012-07-20 ENCOUNTER — Other Ambulatory Visit (HOSPITAL_BASED_OUTPATIENT_CLINIC_OR_DEPARTMENT_OTHER): Payer: BC Managed Care – PPO | Admitting: Lab

## 2012-07-20 ENCOUNTER — Telehealth: Payer: Self-pay | Admitting: Oncology

## 2012-07-20 VITALS — BP 120/79 | HR 54 | Temp 96.8°F | Resp 18 | Ht 75.0 in | Wt 197.0 lb

## 2012-07-20 DIAGNOSIS — I10 Essential (primary) hypertension: Secondary | ICD-10-CM

## 2012-07-20 DIAGNOSIS — F3289 Other specified depressive episodes: Secondary | ICD-10-CM

## 2012-07-20 DIAGNOSIS — D709 Neutropenia, unspecified: Secondary | ICD-10-CM

## 2012-07-20 DIAGNOSIS — D72819 Decreased white blood cell count, unspecified: Secondary | ICD-10-CM

## 2012-07-20 DIAGNOSIS — F329 Major depressive disorder, single episode, unspecified: Secondary | ICD-10-CM

## 2012-07-20 LAB — CBC WITH DIFFERENTIAL/PLATELET
BASO%: 0.6 % (ref 0.0–2.0)
LYMPH%: 47.2 % (ref 14.0–49.0)
MCHC: 32.9 g/dL (ref 32.0–36.0)
MONO#: 0.3 10*3/uL (ref 0.1–0.9)
RBC: 4.44 10*6/uL (ref 4.20–5.82)
RDW: 13.3 % (ref 11.0–14.6)
WBC: 1.8 10*3/uL — ABNORMAL LOW (ref 4.0–10.3)
lymph#: 0.9 10*3/uL (ref 0.9–3.3)

## 2012-07-20 LAB — COMPREHENSIVE METABOLIC PANEL (CC13)
ALT: 19 U/L (ref 0–55)
Albumin: 3.7 g/dL (ref 3.5–5.0)
CO2: 30 mEq/L — ABNORMAL HIGH (ref 22–29)
Chloride: 103 mEq/L (ref 98–107)
Potassium: 4.2 mEq/L (ref 3.5–5.1)
Sodium: 140 mEq/L (ref 136–145)
Total Bilirubin: 0.71 mg/dL (ref 0.20–1.20)
Total Protein: 6.7 g/dL (ref 6.4–8.3)

## 2012-07-20 NOTE — Progress Notes (Signed)
[facility]  [phone] [phone]   OFFICE PROGRESS NOTE   Cc:  Georgann Housekeeper, MD  DIAGNOSIS: chronic neutropenia, unclear etiology.  Past work up including bone marrow biopsy was negative.   CURRENT THERAPY:  Watchful observation.   INTERVAL HISTORY: Brent Massey 56 y.o. male returns for regular follow up by himself.  He reports feeling relatively well.  He has stable depression and has been on Lexapro for about 3 years.  He has mild insomnia at night.  He works full time without fatigue. He denied constitutional symptoms, recurrent infection, bleeding.   Patient denies fever, anorexia, weight loss, fatigue, headache, visual changes, confusion, drenching night sweats, palpable lymph node swelling, mucositis, odynophagia, dysphagia, nausea vomiting, jaundice, chest pain, palpitation, shortness of breath, dyspnea on exertion, productive cough, gum bleeding, epistaxis, hematemesis, hemoptysis, abdominal pain, abdominal swelling, early satiety, melena, hematochezia, hematuria, skin rash, spontaneous bleeding, joint swelling, joint pain, heat or cold intolerance, bowel bladder incontinence, back pain, focal motor weakness, paresthesia.    Past Medical History  Diagnosis Date  . Leukocytopenia   . Depression   . Hypertension   . BPH (benign prostatic hyperplasia)   . GERD (gastroesophageal reflux disease)   . Neutropenia, unspecified     Past Surgical History  Procedure Date  . Wrist fracture surgery   . Hernia repair     Current Outpatient Prescriptions  Medication Sig Dispense Refill  . DIGESTIVE ENZYMES PO Take 1 tablet by mouth 3 (three) times daily before meals.      Marland Kitchen escitalopram (LEXAPRO) 20 MG tablet Take 20 mg by mouth daily.      . fish oil-omega-3 fatty acids 1000 MG capsule Take 1 g by mouth daily.       Marland Kitchen loratadine (CLARITIN) 10 MG tablet Take 10 mg by mouth daily as needed. For allergies.      Marland Kitchen losartan-hydrochlorothiazide  (HYZAAR) 100-25 MG per tablet Take 0.5 tablets by mouth daily.       . mometasone (NASONEX) 50 MCG/ACT nasal spray Place 2 sprays into the nose daily as needed.       . Multiple Vitamin (MULTIVITAMIN WITH MINERALS) TABS Take 1 tablet by mouth daily.      . Probiotic Product (PROBIOTIC FORMULA) CAPS Take 1 capsule by mouth daily. 3.4 Billion      . Saw Palmetto, Serenoa repens, (SAW PALMETTO PO) Take by mouth daily.         ALLERGIES:   has no known allergies.  REVIEW OF SYSTEMS:  The rest of the 14-point review of system was negative.   Filed Vitals:   07/20/12 1207  BP: 120/79  Pulse: 54  Temp: 96.8 F (36 C)  Resp: 18   Wt Readings from Last 3 Encounters:  07/20/12 197 lb (89.359 kg)  01/25/12 194 lb (87.998 kg)  01/20/12 194 lb (87.998 kg)   ECOG Performance status: 0  PHYSICAL EXAMINATION:   General:  well-nourished man, in no acute distress.  Eyes:  no scleral icterus.  ENT:  There were no oropharyngeal lesions.  Neck was without thyromegaly.  Lymphatics:  Negative cervical, supraclavicular or axillary adenopathy.  Respiratory: lungs were clear bilaterally without wheezing or crackles.  Cardiovascular:  Regular rate and rhythm, S1/S2, without murmur, rub or gallop.  There was no pedal edema.  GI:  abdomen was soft, flat, nontender, nondistended, without organomegaly.  Muscoloskeletal:  no spinal tenderness of palpation of vertebral spine.  Skin exam was without echymosis, petichae.  Neuro  exam was nonfocal.  Patient was able to get on and off exam table without assistance.  Gait was normal.  Patient was alerted and oriented.  Attention was good.   Language was appropriate.  Mood was normal without depression.  Speech was not pressured.  Thought content was not tangential.     LABORATORY/RADIOLOGY DATA:  Lab Results  Component Value Date   WBC 1.8* 07/20/2012   HGB 13.8 07/20/2012   HCT 41.9 07/20/2012   PLT 187 07/20/2012   GLUCOSE 95 01/20/2012   ALKPHOS 54 01/20/2012    ALT 15 01/20/2012   AST 22 01/20/2012   NA 140 01/20/2012   K 4.1 01/20/2012   CL 104 01/20/2012   CREATININE 1.06 01/20/2012   BUN 14 01/20/2012   CO2 30 01/20/2012   INR 1.07 06/20/2011   I personally reviewed the patient's peripheral blood smear today.  There was isocytosis.  There was no peripheral blast. There was decrease in WBC.  There was no schistocytosis, spherocytosis, target cell, rouleaux formation, tear drop cell.  There was no giant platelets or platelet clumps.     ASSESSMENT AND PLAN:   1.  Depression:  On Lexapro with stable mood.  2.  Hypertension:  On Hyzaar per PCP.   3.  Chronic neutropenia, NOS:   - Causes:  Most likely congenital, hereditary.  Past work up including bone marrow biopsy did not show leukemia or bone marrow failure.  I cannot rule out Lexapro as a potential cause of his neutropenia.  However, last bone marrow biopsy did not show agranulocytosis.  I do not endorse stopping Lexapro since it has been working for him.  -  Recommendation:  Lab with the Cancer Center in about 6, 12, and 18 months.  Return visit in about 2 years.   4.  Follow up:  Lab only appointment in about 6, 12, and 18 months.  Return visit in about 2 years but sooner if concerning symptoms especially recurrent infections.       The length of time of the face-to-face encounter was 10 minutes. More than 50% of time was spent counseling and coordination of care.

## 2012-07-20 NOTE — Patient Instructions (Addendum)
1.  Issue:  Low neutrophil (neutropenia, leukopenia).  2.  Causes:  Most likely congenital, hereditary.  Past work up including bone marrow biopsy did not show leukemia or bone marrow failure. 3.  Recommendation:  Lab with the Cancer Center in about 6, 12, and 18 months.  Return visit in about 2 years.

## 2012-07-20 NOTE — Telephone Encounter (Signed)
gv and printed appt schedule for pt for May and NOV 2014....books do not go out that far for for to 78yrs

## 2012-12-08 ENCOUNTER — Ambulatory Visit (INDEPENDENT_AMBULATORY_CARE_PROVIDER_SITE_OTHER): Payer: BC Managed Care – PPO | Admitting: Physician Assistant

## 2012-12-08 VITALS — BP 148/82 | HR 79 | Temp 98.2°F | Resp 17 | Ht 72.75 in | Wt 202.0 lb

## 2012-12-08 DIAGNOSIS — J309 Allergic rhinitis, unspecified: Secondary | ICD-10-CM

## 2012-12-08 DIAGNOSIS — R059 Cough, unspecified: Secondary | ICD-10-CM

## 2012-12-08 DIAGNOSIS — J329 Chronic sinusitis, unspecified: Secondary | ICD-10-CM

## 2012-12-08 DIAGNOSIS — R05 Cough: Secondary | ICD-10-CM

## 2012-12-08 MED ORDER — AMOXICILLIN 875 MG PO TABS: 875.0000 mg | ORAL_TABLET | Freq: Two times a day (BID) | ORAL | Status: DC

## 2012-12-08 MED ORDER — HYDROCODONE-HOMATROPINE 5-1.5 MG/5ML PO SYRP: 5.0000 mL | ORAL_SOLUTION | Freq: Three times a day (TID) | ORAL | Status: DC | PRN

## 2012-12-08 NOTE — Patient Instructions (Signed)
Take cough syrup at night time as needed for cough. May take up to every 8 hours if needed. May cause sedation Take amoxicillin twice daily x 10 days Mucinex (guafenisen) OTC as directed for congestion Start nasonex twice daily.

## 2012-12-08 NOTE — Progress Notes (Signed)
  Subjective:    Patient ID: Brent Massey, male    DOB: 03/03/1956, 57 y.o.   MRN: [id number]  HPI 57 year old male presents with 4 day history of nasal congestion, rhinorrhea, PND, cough, chills, and sinus pressure.  States symptoms started suddenly and have progressively worsened.  Is here today mainly because he has not been able to sleep secondary to PND and coughing. Does have a history of allergies for which he uses nasonex.  Has not been taking this because he was unsure if this illness is due to allergies or a cold. Has been taking OTC cold preps which have helped the chills and loosen the congestion.  Denies otalgia, nausea, vomiting, sore throat, SOB, wheezing, or hemoptysis.    History of HTN controlled with hyaar followed by PCP.  Depression stable on Lexapro. Past work-up of neutropenia revealed negative bone marrow biopsy and blood smear. Stable and being monitored regularly by Oncology.     Review of Systems  Constitutional: Positive for chills. Negative for fever.  HENT: Positive for congestion, rhinorrhea, postnasal drip and sinus pressure. Negative for ear pain, sore throat and neck stiffness.   Respiratory: Positive for cough. Negative for shortness of breath and wheezing.   Cardiovascular: Negative for chest pain.  Gastrointestinal: Negative for nausea, vomiting and abdominal pain.  Allergic/Immunologic: Positive for environmental allergies.  Neurological: Positive for headaches (sinus). Negative for dizziness.       Objective:   Physical Exam  Constitutional: He is oriented to person, place, and time. He appears well-developed and well-nourished.  HENT:  Head: Normocephalic and atraumatic.  Right Ear: Hearing, tympanic membrane, external ear and ear canal normal.  Left Ear: Hearing, tympanic membrane, external ear and ear canal normal.  Mouth/Throat: Uvula is midline and mucous membranes are normal. Posterior oropharyngeal erythema (clear postnasal drainage) present. No  oropharyngeal exudate.  Eyes: Conjunctivae are normal.  Neck: Normal range of motion.  Cardiovascular: Normal rate, regular rhythm and normal heart sounds.   Pulmonary/Chest: Effort normal and breath sounds normal.  Lymphadenopathy:    He has no cervical adenopathy.  Neurological: He is alert and oriented to person, place, and time.  Psychiatric: He has a normal mood and affect. His behavior is normal. Judgment and thought content normal.          Assessment & Plan:  Cough - Plan: HYDROcodone-homatropine (HYCODAN) 5-1.5 MG/5ML syrup  Sinusitis - Plan: amoxicillin (AMOXIL) 875 MG tablet  Amoxicillin 875 mg bid x 10 days Hycodan qhs prn cough - may cause sedation Increase fluids and rest Out of work 12/09/12 if needed Mucinex OTC as directed Start nasonex twice daily.  Follow up if symptoms worsen or fail to improve.

## 2013-01-18 ENCOUNTER — Encounter: Payer: Self-pay | Admitting: Oncology

## 2013-01-18 ENCOUNTER — Other Ambulatory Visit (HOSPITAL_BASED_OUTPATIENT_CLINIC_OR_DEPARTMENT_OTHER): Payer: BC Managed Care – PPO | Admitting: Lab

## 2013-01-18 DIAGNOSIS — D72819 Decreased white blood cell count, unspecified: Secondary | ICD-10-CM

## 2013-01-18 DIAGNOSIS — D709 Neutropenia, unspecified: Secondary | ICD-10-CM

## 2013-01-18 LAB — CBC WITH DIFFERENTIAL/PLATELET
BASO%: 0.7 % (ref 0.0–2.0)
LYMPH%: 24.6 % (ref 14.0–49.0)
MCHC: 32.8 g/dL (ref 32.0–36.0)
MONO#: 0.4 10*3/uL (ref 0.1–0.9)
MONO%: 13 % (ref 0.0–14.0)
Platelets: 180 10*3/uL (ref 140–400)
RBC: 4.23 10*6/uL (ref 4.20–5.82)
WBC: 2.8 10*3/uL — ABNORMAL LOW (ref 4.0–10.3)

## 2013-07-19 ENCOUNTER — Other Ambulatory Visit (HOSPITAL_BASED_OUTPATIENT_CLINIC_OR_DEPARTMENT_OTHER): Payer: BC Managed Care – PPO | Admitting: Lab

## 2013-07-19 ENCOUNTER — Other Ambulatory Visit: Payer: Self-pay | Admitting: *Deleted

## 2013-07-19 DIAGNOSIS — D72819 Decreased white blood cell count, unspecified: Secondary | ICD-10-CM

## 2013-07-19 LAB — COMPREHENSIVE METABOLIC PANEL (CC13)
ALT: 14 U/L (ref 0–55)
Anion Gap: 9 mEq/L (ref 3–11)
CO2: 28 mEq/L (ref 22–29)
Calcium: 9 mg/dL (ref 8.4–10.4)
Chloride: 104 mEq/L (ref 98–109)
Sodium: 140 mEq/L (ref 136–145)
Total Protein: 7.1 g/dL (ref 6.4–8.3)

## 2013-07-19 LAB — CBC WITH DIFFERENTIAL/PLATELET
BASO%: 1.4 % (ref 0.0–2.0)
HCT: 43.3 % (ref 38.4–49.9)
MCHC: 32.6 g/dL (ref 32.0–36.0)
MONO#: 0.3 10*3/uL (ref 0.1–0.9)
RBC: 4.4 10*6/uL (ref 4.20–5.82)
WBC: 1.9 10*3/uL — ABNORMAL LOW (ref 4.0–10.3)
lymph#: 0.7 10*3/uL — ABNORMAL LOW (ref 0.9–3.3)

## 2013-12-07 ENCOUNTER — Ambulatory Visit (INDEPENDENT_AMBULATORY_CARE_PROVIDER_SITE_OTHER): Payer: BC Managed Care – PPO | Admitting: Family Medicine

## 2013-12-07 VITALS — BP 112/82 | HR 88 | Temp 100.0°F | Resp 16 | Ht 75.0 in | Wt 195.4 lb

## 2013-12-07 DIAGNOSIS — J309 Allergic rhinitis, unspecified: Secondary | ICD-10-CM

## 2013-12-07 DIAGNOSIS — D72819 Decreased white blood cell count, unspecified: Secondary | ICD-10-CM

## 2013-12-07 DIAGNOSIS — J302 Other seasonal allergic rhinitis: Secondary | ICD-10-CM

## 2013-12-07 DIAGNOSIS — R05 Cough: Secondary | ICD-10-CM

## 2013-12-07 DIAGNOSIS — J069 Acute upper respiratory infection, unspecified: Secondary | ICD-10-CM

## 2013-12-07 DIAGNOSIS — B9789 Other viral agents as the cause of diseases classified elsewhere: Secondary | ICD-10-CM

## 2013-12-07 DIAGNOSIS — R059 Cough, unspecified: Secondary | ICD-10-CM

## 2013-12-07 LAB — POCT CBC
Granulocyte percent: 55.4 %G (ref 37–80)
HCT, POC: 43.9 % (ref 43.5–53.7)
Hemoglobin: 14.2 g/dL (ref 14.1–18.1)
Lymph, poc: 0.6 (ref 0.6–3.4)
MCH, POC: 31.7 pg — AB (ref 27–31.2)
MCHC: 32.3 g/dL (ref 31.8–35.4)
MCV: 97.9 fL — AB (ref 80–97)
MID (cbc): 0.2 (ref 0–0.9)
MPV: 9.5 fL (ref 0–99.8)
POC Granulocyte: 0.9 — AB (ref 2–6.9)
POC LYMPH PERCENT: 35.2 % (ref 10–50)
POC MID %: 9.4 % (ref 0–12)
Platelet Count, POC: 156 10*3/uL (ref 142–424)
RBC: 4.48 M/uL — AB (ref 4.69–6.13)
RDW, POC: 13.2 %
WBC: 1.7 10*3/uL — AB (ref 4.6–10.2)

## 2013-12-07 MED ORDER — HYDROCODONE-HOMATROPINE 5-1.5 MG/5ML PO SYRP: 5.0000 mL | ORAL_SOLUTION | Freq: Three times a day (TID) | ORAL | Status: DC | PRN

## 2013-12-07 MED ORDER — BENZONATATE 100 MG PO CAPS: 200.0000 mg | ORAL_CAPSULE | Freq: Two times a day (BID) | ORAL | Status: DC | PRN

## 2013-12-07 NOTE — Progress Notes (Signed)
Chief Complaint:  Chief Complaint  Patient presents with  . Cough    uri symptoms, chills, low grade fever     HPI: Brent Massey is a 58 y.o. male who is here for  4 day history of cough and URI ss with chills, low grade temp, he has allergies and also has neutropenia of unknown etiology He has had a dry cough but tried vicks vapor rub with releif. Yesterday was 99.3, Today it was 100. He denies any CP or SOB. Has some body aches, denies any SOB, CP, unintentional weightloss, night sweats.  He has a h./o leukopenia with negative bone marrow workup by hematologist. Last CBC doen by Dr Benson Norway was 1.9.   Past Medical History  Diagnosis Date  . Leukocytopenia   . Depression   . Hypertension   . BPH (benign prostatic hyperplasia)   . GERD (gastroesophageal reflux disease)   . Neutropenia, unspecified   . Allergy    Past Surgical History  Procedure Laterality Date  . Wrist fracture surgery    . Hernia repair    . Fracture surgery     History   Social History  . Marital Status: Married    Spouse Name: N/A    Number of Children: N/A  . Years of Education: N/A   Social History Main Topics  . Smoking status: Never Smoker   . Smokeless tobacco: None  . Alcohol Use: No  . Drug Use: No  . Sexual Activity: Yes   Other Topics Concern  . None   Social History Narrative  . None   Family History  Problem Relation Age of Onset  . Depression Sister   . Depression Brother   . Prostate cancer Brother    No Known Allergies Prior to Admission medications   Medication Sig Start Date End Date Taking? Authorizing Provider  escitalopram (LEXAPRO) 20 MG tablet Take 20 mg by mouth daily.   Yes Historical Provider, MD  HYDROcodone-homatropine (HYCODAN) 5-1.5 MG/5ML syrup Take 5 mLs by mouth every 8 (eight) hours as needed for cough. 12/08/12  Yes Heather M Marte, PA-C  loratadine (CLARITIN) 10 MG tablet Take 10 mg by mouth daily as needed. For allergies.   Yes Historical Provider,  MD  losartan-hydrochlorothiazide (HYZAAR) 100-25 MG per tablet Take 0.5 tablets by mouth daily.    Yes Historical Provider, MD  mometasone (NASONEX) 50 MCG/ACT nasal spray Place 2 sprays into the nose daily as needed.    Yes Historical Provider, MD  Multiple Vitamin (MULTIVITAMIN WITH MINERALS) TABS Take 1 tablet by mouth daily.   Yes Historical Provider, MD  Probiotic Product (PROBIOTIC FORMULA) CAPS Take 1 capsule by mouth daily. 3.4 Billion   Yes Historical Provider, MD  Saw Palmetto, Serenoa repens, (SAW PALMETTO PO) Take by mouth daily.    Yes Historical Provider, MD  amoxicillin (AMOXIL) 875 MG tablet Take 1 tablet (875 mg total) by mouth 2 (two) times daily. 12/08/12   Collene Leyden, PA-C  fish oil-omega-3 fatty acids 1000 MG capsule Take 1 g by mouth daily.     Historical Provider, MD     ROS: The patient denies  night sweats, unintentional weight loss, chest pain, palpitations, wheezing, dyspnea on exertion, nausea, vomiting, abdominal pain, dysuria, hematuria, melena, numbness, weakness, or tingling.   All other systems have been reviewed and were otherwise negative with the exception of those mentioned in the HPI and as above.    PHYSICAL EXAM: Filed Vitals:  12/07/13 1050  BP: 112/82  Pulse: 88  Temp: 100 F (37.8 C)  Resp: 16  SPo2 98% , repeat temp was 99.3  Filed Vitals:   12/07/13 1050  Height: 6\' 3"  (1.905 m)  Weight: 195 lb 6.4 oz (88.633 kg)   Body mass index is 24.42 kg/(m^2).  General: Alert, no acute distress HEENT:  Normocephalic, atraumatic, oropharynx patent. EOMI, PERRLA, TM nl, nontender sinuses Cardiovascular:  Regular rate and rhythm, no rubs murmurs or gallops.  No Carotid bruits, radial pulse intact. No pedal edema.  Respiratory: Clear to auscultation bilaterally.  No wheezes, rales, or rhonchi.  No cyanosis, no use of accessory musculature GI: No organomegaly, abdomen is soft and non-tender, positive bowel sounds.  No masses. Skin: No  rashes. Neurologic: Facial musculature symmetric. Psychiatric: Patient is appropriate throughout our interaction. Lymphatic: No cervical lymphadenopathy Musculoskeletal: Gait intact.   LABS: Results for orders placed in visit on 12/07/13  POCT CBC      Result Value Ref Range   WBC 1.7 (*) 4.6 - 10.2 K/uL   Lymph, poc 0.6  0.6 - 3.4   POC LYMPH PERCENT 35.2  10 - 50 %L   MID (cbc) 0.2  0 - 0.9   POC MID % 9.4  0 - 12 %M   POC Granulocyte 0.9 (*) 2 - 6.9   Granulocyte percent 55.4  37 - 80 %G   RBC 4.48 (*) 4.69 - 6.13 M/uL   Hemoglobin 14.2  14.1 - 18.1 g/dL   HCT, POC 43.9  43.5 - 53.7 %   MCV 97.9 (*) 80 - 97 fL   MCH, POC 31.7 (*) 27 - 31.2 pg   MCHC 32.3  31.8 - 35.4 g/dL   RDW, POC 13.2     Platelet Count, POC 156  142 - 424 K/uL   MPV 9.5  0 - 99.8 fL     EKG/XRAY:   Primary read interpreted by Dr. Marin Comment at [facility].   ASSESSMENT/PLAN: Encounter Diagnoses  Name Primary?  . Cough Yes  . Seasonal allergies   . Leukocytopenia   . Viral URI with cough    WBC stable at 1.7 Sx treatment  C/w nasonex and antihistamine Vicks vapor rub If fever persists then will give amox but needs to call me so we can discuss sxs F/u prn  Gross sideeffects, risk and benefits, and alternatives of medications d/w patient. Patient is aware that all medications have potential sideeffects and we are unable to predict every sideeffect or drug-drug interaction that may occur.  Glenford Bayley, DO 12/07/2013 12:17 PM

## 2013-12-08 ENCOUNTER — Emergency Department (HOSPITAL_COMMUNITY): Payer: BC Managed Care – PPO

## 2013-12-08 ENCOUNTER — Encounter (HOSPITAL_COMMUNITY): Payer: Self-pay | Admitting: Emergency Medicine

## 2013-12-08 ENCOUNTER — Emergency Department (HOSPITAL_COMMUNITY)
Admission: EM | Admit: 2013-12-08 | Discharge: 2013-12-08 | Disposition: A | Discharge status: Home / Self Care | Payer: BC Managed Care – PPO | Attending: Emergency Medicine | Admitting: Emergency Medicine

## 2013-12-08 DIAGNOSIS — Z862 Personal history of diseases of the blood and blood-forming organs and certain disorders involving the immune mechanism: Secondary | ICD-10-CM | POA: Insufficient documentation

## 2013-12-08 DIAGNOSIS — Y990 Civilian activity done for income or pay: Secondary | ICD-10-CM | POA: Insufficient documentation

## 2013-12-08 DIAGNOSIS — S0083XA Contusion of other part of head, initial encounter: Secondary | ICD-10-CM | POA: Insufficient documentation

## 2013-12-08 DIAGNOSIS — IMO0002 Reserved for concepts with insufficient information to code with codable children: Secondary | ICD-10-CM | POA: Insufficient documentation

## 2013-12-08 DIAGNOSIS — I10 Essential (primary) hypertension: Secondary | ICD-10-CM | POA: Insufficient documentation

## 2013-12-08 DIAGNOSIS — F329 Major depressive disorder, single episode, unspecified: Secondary | ICD-10-CM | POA: Insufficient documentation

## 2013-12-08 DIAGNOSIS — Y9389 Activity, other specified: Secondary | ICD-10-CM | POA: Insufficient documentation

## 2013-12-08 DIAGNOSIS — W1809XA Striking against other object with subsequent fall, initial encounter: Secondary | ICD-10-CM | POA: Insufficient documentation

## 2013-12-08 DIAGNOSIS — R42 Dizziness and giddiness: Secondary | ICD-10-CM | POA: Insufficient documentation

## 2013-12-08 DIAGNOSIS — Z8719 Personal history of other diseases of the digestive system: Secondary | ICD-10-CM | POA: Insufficient documentation

## 2013-12-08 DIAGNOSIS — D72819 Decreased white blood cell count, unspecified: Secondary | ICD-10-CM | POA: Insufficient documentation

## 2013-12-08 DIAGNOSIS — Y9289 Other specified places as the place of occurrence of the external cause: Secondary | ICD-10-CM | POA: Insufficient documentation

## 2013-12-08 DIAGNOSIS — S0003XA Contusion of scalp, initial encounter: Secondary | ICD-10-CM | POA: Insufficient documentation

## 2013-12-08 DIAGNOSIS — F3289 Other specified depressive episodes: Secondary | ICD-10-CM | POA: Insufficient documentation

## 2013-12-08 DIAGNOSIS — N400 Enlarged prostate without lower urinary tract symptoms: Secondary | ICD-10-CM | POA: Insufficient documentation

## 2013-12-08 DIAGNOSIS — Z79899 Other long term (current) drug therapy: Secondary | ICD-10-CM | POA: Insufficient documentation

## 2013-12-08 DIAGNOSIS — R55 Syncope and collapse: Secondary | ICD-10-CM

## 2013-12-08 DIAGNOSIS — S1093XA Contusion of unspecified part of neck, initial encounter: Secondary | ICD-10-CM

## 2013-12-08 LAB — CBC WITH DIFFERENTIAL/PLATELET
BASOS ABS: 0 10*3/uL (ref 0.0–0.1)
BASOS PCT: 0 % (ref 0–1)
EOS PCT: 0 % (ref 0–5)
Eosinophils Absolute: 0 10*3/uL (ref 0.0–0.7)
HEMATOCRIT: 40.8 % (ref 39.0–52.0)
Hemoglobin: 13.3 g/dL (ref 13.0–17.0)
Lymphocytes Relative: 12 % (ref 12–46)
Lymphs Abs: 0.3 10*3/uL — ABNORMAL LOW (ref 0.7–4.0)
MCH: 31.4 pg (ref 26.0–34.0)
MCHC: 32.6 g/dL (ref 30.0–36.0)
MCV: 96.2 fL (ref 78.0–100.0)
MONO ABS: 0.4 10*3/uL (ref 0.1–1.0)
Monocytes Relative: 17 % — ABNORMAL HIGH (ref 3–12)
NEUTROS ABS: 1.9 10*3/uL (ref 1.7–7.7)
Neutrophils Relative %: 70 % (ref 43–77)
PLATELETS: 115 10*3/uL — AB (ref 150–400)
RBC: 4.24 MIL/uL (ref 4.22–5.81)
RDW: 13.2 % (ref 11.5–15.5)
WBC: 2.6 10*3/uL — ABNORMAL LOW (ref 4.0–10.5)

## 2013-12-08 LAB — COMPREHENSIVE METABOLIC PANEL
ALBUMIN: 3.4 g/dL — AB (ref 3.5–5.2)
ALT: 13 U/L (ref 0–53)
AST: 33 U/L (ref 0–37)
Alkaline Phosphatase: 69 U/L (ref 39–117)
BUN: 21 mg/dL (ref 6–23)
CALCIUM: 8.7 mg/dL (ref 8.4–10.5)
CO2: 28 mEq/L (ref 19–32)
CREATININE: 1.25 mg/dL (ref 0.50–1.35)
Chloride: 101 mEq/L (ref 96–112)
GFR calc Af Amer: 72 mL/min — ABNORMAL LOW (ref >90)
GFR calc non Af Amer: 62 mL/min — ABNORMAL LOW (ref >90)
Glucose, Bld: 96 mg/dL (ref 70–99)
Potassium: 3.3 mEq/L — ABNORMAL LOW (ref 3.7–5.3)
SODIUM: 141 meq/L (ref 137–147)
Total Bilirubin: 0.4 mg/dL (ref 0.3–1.2)
Total Protein: 6.8 g/dL (ref 6.0–8.3)

## 2013-12-08 LAB — I-STAT TROPONIN, ED: Troponin i, poc: 0 ng/mL (ref 0.00–0.08)

## 2013-12-08 MED ORDER — SODIUM CHLORIDE 0.9 % IV SOLN
1000.0000 mL | Freq: Once | INTRAVENOUS | Status: AC
  Administered 2013-12-08: 1000 mL via INTRAVENOUS

## 2013-12-08 MED ORDER — SODIUM CHLORIDE 0.9 % IV SOLN: 1000.0000 mL | INTRAVENOUS | Status: DC

## 2013-12-08 NOTE — ED Provider Notes (Signed)
CSN: [id number]     Arrival date & time 12/08/13  9833 History   First MD Initiated Contact with Patient 12/08/13 [phone]     Chief Complaint  Patient presents with  . Loss of Consciousness    HPI Patient presents to the emergency room after a syncopal episode. Patient was at work today. He was feeling well without any particular complaints. However, while working he started to feel lightheaded and faint. Coworkers found the patient lying on the floor. Patient had this once before when he was having trouble with diarrhea so he decided to just continue working.  Next thing he remembers is being treated by EMS. The patient did strike his head and does have a headache and significant pain in the left side of his head. He does not have any neck pain. He denies any back pain. He's not having any chest pain, abdominal pain, vomiting or diarrhea. Patient does not have any history of seizures.  Patient has had some recent colds URI symptoms. He saw Dr. in urgent care yesterday. He has been taking allergy and cold medications. Past Medical History  Diagnosis Date  . Leukocytopenia   . Depression   . Hypertension   . BPH (benign prostatic hyperplasia)   . GERD (gastroesophageal reflux disease)   . Neutropenia, unspecified   . Allergy    Past Surgical History  Procedure Laterality Date  . Wrist fracture surgery    . Hernia repair    . Fracture surgery     Family History  Problem Relation Age of Onset  . Depression Sister   . Depression Brother   . Prostate cancer Brother    History  Substance Use Topics  . Smoking status: Never Smoker   . Smokeless tobacco: Not on file  . Alcohol Use: No    Review of Systems  All other systems reviewed and are negative.     Allergies  Review of patient's allergies indicates no known allergies.  Home Medications   Prior to Admission medications   Medication Sig Start Date End Date Taking? Authorizing Provider  benzonatate (TESSALON) 100 MG  capsule Take 2 capsules (200 mg total) by mouth 2 (two) times daily as needed for cough. 12/07/13   Thao P Le, DO  escitalopram (LEXAPRO) 20 MG tablet Take 20 mg by mouth daily.    Historical Provider, MD  fish oil-omega-3 fatty acids 1000 MG capsule Take 1 g by mouth daily.     Historical Provider, MD  HYDROcodone-homatropine (HYCODAN) 5-1.5 MG/5ML syrup Take 5 mLs by mouth every 8 (eight) hours as needed for cough. 12/07/13   Thao P Le, DO  loratadine (CLARITIN) 10 MG tablet Take 10 mg by mouth daily as needed. For allergies.    Historical Provider, MD  losartan-hydrochlorothiazide (HYZAAR) 100-25 MG per tablet Take 0.5 tablets by mouth daily.     Historical Provider, MD  mometasone (NASONEX) 50 MCG/ACT nasal spray Place 2 sprays into the nose daily as needed.     Historical Provider, MD  Multiple Vitamin (MULTIVITAMIN WITH MINERALS) TABS Take 1 tablet by mouth daily.    Historical Provider, MD  Probiotic Product (PROBIOTIC FORMULA) CAPS Take 1 capsule by mouth daily. 3.4 Billion    Historical Provider, MD  Saw Palmetto, Serenoa repens, (SAW PALMETTO PO) Take by mouth daily.     Historical Provider, MD   BP 118/74  Pulse 87  Temp(Src) 98.9 F (37.2 C) (Oral)  Resp 18  Ht 6\' 3"  (1.905 m)  Wt 185 lb (83.915 kg)  BMI 23.12 kg/m2  SpO2 100% Physical Exam  Nursing note and vitals reviewed. Constitutional: He is oriented to person, place, and time. He appears well-developed and well-nourished. No distress.  HENT:  Head: Normocephalic.  Right Ear: External ear normal.  Left Ear: External ear normal.  Mouth/Throat: Oropharynx is clear and moist.  Small abrasion left temporal region with hematoma and tenderness  Eyes: Conjunctivae are normal. Right eye exhibits no discharge. Left eye exhibits no discharge. No scleral icterus.  Neck: Neck supple. No tracheal deviation present.  Cardiovascular: Normal rate, regular rhythm and intact distal pulses.   Pulmonary/Chest: Effort normal and breath  sounds normal. No stridor. No respiratory distress. He has no wheezes. He has no rales.  Abdominal: Soft. Bowel sounds are normal. He exhibits no distension. There is no tenderness. There is no rebound and no guarding.  Musculoskeletal: He exhibits no edema and no tenderness.  Entire spine nontender  Neurological: He is alert and oriented to person, place, and time. He has normal strength. No cranial nerve deficit (No facial droop, extraocular movements intact, tongue midline ) or sensory deficit. He exhibits normal muscle tone. He displays no seizure activity. Coordination normal.  No pronator drift bilateral upper extrem, able to hold both legs off bed for 5 seconds, sensation intact in all extremities, no visual field cuts, no left or right sided neglect, normal finger-nose exam bilaterally, no nystagmus noted   Skin: Skin is warm and dry. No rash noted.  Psychiatric: He has a normal mood and affect.    ED Course  Procedures (including critical care time) Labs Review Labs Reviewed  CBC WITH DIFFERENTIAL - Abnormal; Notable for the following:    WBC 2.6 (*)    Platelets 115 (*)    Lymphs Abs 0.3 (*)    Monocytes Relative 17 (*)    All other components within normal limits  COMPREHENSIVE METABOLIC PANEL - Abnormal; Notable for the following:    Potassium 3.3 (*)    Albumin 3.4 (*)    GFR calc non Af Amer 62 (*)    GFR calc Af Amer 72 (*)    All other components within normal limits  Randolm Idol, ED    Imaging Review Dg Chest 2 View  12/08/2013   CLINICAL DATA:  Loss of consciousness.  EXAM: CHEST  2 VIEW  COMPARISON:  Chest x-ray 03/31/2011.  FINDINGS: Mild elevation of the left hemidiaphragm, similar to the prior examination. Lung volumes are normal. No consolidative airspace disease. No pleural effusions. No pneumothorax. No pulmonary nodule or mass noted. Pulmonary vasculature and the cardiomediastinal silhouette are within normal limits. Atherosclerosis in the thoracic  aorta.  IMPRESSION: 1. No radiographic evidence of acute cardiopulmonary disease. 2. Mild elevation left hemidiaphragm. 3. Atherosclerosis.   Electronically Signed   By: Vinnie Langton M.D.   On: 12/08/2013 11:26   Ct Head Wo Contrast  12/08/2013   CLINICAL DATA:  Syncopal episode.  Found face down.  EXAM: CT HEAD WITHOUT CONTRAST  TECHNIQUE: Contiguous axial images were obtained from the base of the skull through the vertex without contrast.  COMPARISON:  None  FINDINGS: Normal appearance of the intracranial structures. No evidence for acute hemorrhage, mass lesion, midline shift, hydrocephalus or large infarct. No acute bony abnormality. The visualized sinuses are clear.  IMPRESSION: No acute intracranial abnormality.  Negative exam.   Electronically Signed   By: Rolla Flatten M.D.   On: 12/08/2013 11:11     EKG  Interpretation   Date/Time:  Sunday December 08 2013 09:56:43 EDT Ventricular Rate:  76 PR Interval:  131 QRS Duration: 79 QT Interval:  363 QTC Calculation: 408 R Axis:   27 Text Interpretation:  Sinus rhythm RSR' in V1 or V2, right VCD or RVH  Borderline T abnormalities, inferior leads No previous tracing Confirmed  by Evaleen Sant  MD-J, Ottis Vacha ([id number]) on 12/08/2013 10:06:18 AM      MDM   Final diagnoses:  Syncope  Leukocytopenia    No sign of serious injury associated with his fall. Syncopal event without definite etiology.  Pt with reassuring evaluation and exam at this time.  Possibly could be vasovagal or related to the medications has been taking for his cold.   Non specific EKG findings but no abnormal rhythm or acute ischemia and pt denies chest pain.  At this time there does not appear to be any evidence of an acute emergency medical condition and the patient appears stable for discharge with appropriate outpatient follow up.      Kathalene Frames, MD 12/08/13 [phone]

## 2013-12-08 NOTE — ED Notes (Signed)
Pt arrived by [facility] for syncopal episode. Pt was at work, had unwitnessed syncopal episode, was found face down on floor. Denies any neck or back pain. VSS pta. Went to [facility] yesterday for cold symptoms and started on cough medicine which he took this am prior to work.

## 2013-12-08 NOTE — Discharge Instructions (Signed)
Syncope  Syncope is a fainting spell. This means the person loses consciousness and drops to the ground. The person is generally unconscious for less than 5 minutes. The person may have some muscle twitches for up to 15 seconds before waking up and returning to normal. Syncope occurs more often in elderly people, but it can happen to anyone. While most causes of syncope are not dangerous, syncope can be a sign of a serious medical problem. It is important to seek medical care.   CAUSES   Syncope is caused by a sudden decrease in blood flow to the brain. The specific cause is often not determined. Factors that can trigger syncope include:   Taking medicines that lower blood pressure.   Sudden changes in posture, such as standing up suddenly.   Taking more medicine than prescribed.   Standing in one place for too long.   Seizure disorders.   Dehydration and excessive exposure to heat.   Low blood sugar (hypoglycemia).   Straining to have a bowel movement.   Heart disease, irregular heartbeat, or other circulatory problems.   Fear, emotional distress, seeing blood, or severe pain.  SYMPTOMS   Right before fainting, you may:   Feel dizzy or lightheaded.   Feel nauseous.   See all white or all black in your field of vision.   Have cold, clammy skin.  DIAGNOSIS   Your caregiver will ask about your symptoms, perform a physical exam, and perform electrocardiography (ECG) to record the electrical activity of your heart. Your caregiver may also perform other heart or blood tests to determine the cause of your syncope.  TREATMENT   In most cases, no treatment is needed. Depending on the cause of your syncope, your caregiver may recommend changing or stopping some of your medicines.  HOME CARE INSTRUCTIONS   Have someone stay with you until you feel stable.   Do not drive, operate machinery, or play sports until your caregiver says it is okay.   Keep all follow-up appointments as directed by your  caregiver.   Lie down right away if you start feeling like you might faint. Breathe deeply and steadily. Wait until all the symptoms have passed.   Drink enough fluids to keep your urine clear or pale yellow.   If you are taking blood pressure or heart medicine, get up slowly, taking several minutes to sit and then stand. This can reduce dizziness.  SEEK IMMEDIATE MEDICAL CARE IF:    You have a severe headache.   You have unusual pain in the chest, abdomen, or back.   You are bleeding from the mouth or rectum, or you have black or tarry stool.   You have an irregular or very fast heartbeat.   You have pain with breathing.   You have repeated fainting or seizure-like jerking during an episode.   You faint when sitting or lying down.   You have confusion.   You have difficulty walking.   You have severe weakness.   You have vision problems.  If you fainted, call your local emergency services (911 in [place] Do not drive yourself to the hospital.   MAKE SURE YOU:   Understand these instructions.   Will watch your condition.   Will get help right away if you are not doing well or get worse.  Document Released: 08/08/2005 Document Revised: 02/07/2012 Document Reviewed: 10/07/2011  [employer] Patient Information 2014 [employer]

## 2014-01-28 ENCOUNTER — Other Ambulatory Visit: Payer: Self-pay | Admitting: Internal Medicine

## 2014-01-28 DIAGNOSIS — R51 Headache: Secondary | ICD-10-CM

## 2014-01-28 DIAGNOSIS — R270 Ataxia, unspecified: Secondary | ICD-10-CM

## 2014-01-28 DIAGNOSIS — R519 Headache, unspecified: Secondary | ICD-10-CM

## 2014-01-28 DIAGNOSIS — R42 Dizziness and giddiness: Secondary | ICD-10-CM

## 2014-02-06 ENCOUNTER — Ambulatory Visit
Admission: RE | Admit: 2014-02-06 | Discharge: 2014-02-06 | Disposition: A | Discharge status: Home / Self Care | Payer: BC Managed Care – PPO | Source: Ambulatory Visit | Attending: Internal Medicine | Admitting: Internal Medicine

## 2014-02-06 DIAGNOSIS — R519 Headache, unspecified: Secondary | ICD-10-CM

## 2014-02-06 DIAGNOSIS — R51 Headache: Secondary | ICD-10-CM

## 2014-02-06 DIAGNOSIS — R42 Dizziness and giddiness: Secondary | ICD-10-CM

## 2014-02-06 DIAGNOSIS — R270 Ataxia, unspecified: Secondary | ICD-10-CM

## 2015-05-01 ENCOUNTER — Other Ambulatory Visit: Payer: Self-pay | Admitting: General Surgery

## 2015-05-01 DIAGNOSIS — R1909 Other intra-abdominal and pelvic swelling, mass and lump: Secondary | ICD-10-CM

## 2015-05-05 ENCOUNTER — Other Ambulatory Visit: Payer: Self-pay

## 2015-05-06 ENCOUNTER — Other Ambulatory Visit: Payer: Self-pay

## 2015-05-08 ENCOUNTER — Ambulatory Visit
Admission: RE | Admit: 2015-05-08 | Discharge: 2015-05-08 | Disposition: A | Discharge status: Home / Self Care | Payer: BLUE CROSS/BLUE SHIELD | Source: Ambulatory Visit | Attending: General Surgery | Admitting: General Surgery

## 2015-05-08 DIAGNOSIS — R1909 Other intra-abdominal and pelvic swelling, mass and lump: Secondary | ICD-10-CM

## 2015-05-08 IMAGING — CT CT PELVIS W/ CM
1 series · 15 of 32 positions shown, 19 images · IV contrast (APPLIED)
Comparison: None.

CLINICAL DATA: Right groin mass and pain for 3 months. Previous
bilateral hernia repair.

EXAM:
CT PELVIS WITH CONTRAST
TECHNIQUE: Multidetector CT imaging of the pelvis was performed using the
standard protocol following the bolus administration of intravenous
contrast.
CONTRAST:  100 mL Omnipaque 300

[Series 2: routine pelvis w/cm · axial · 0.82mm/px · z∈[+672,+922]mm · 15 of 56 slices shown, 19 images]
[im 4/56  soft-tissue]
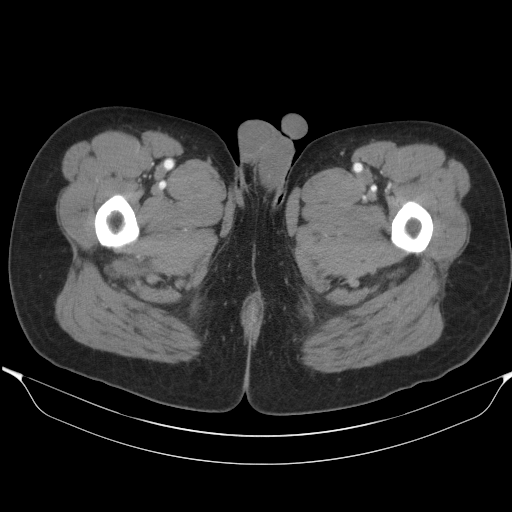
[im 4/56  bone]
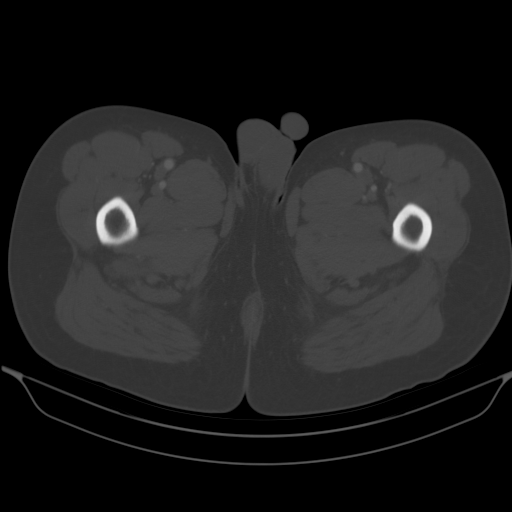
[im 8/56  soft-tissue]
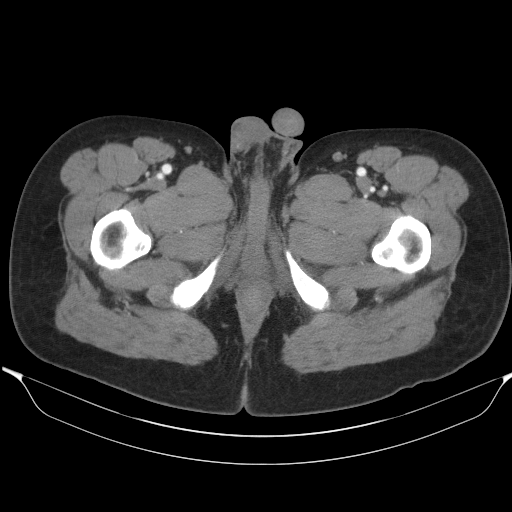
[im 11/56  soft-tissue]
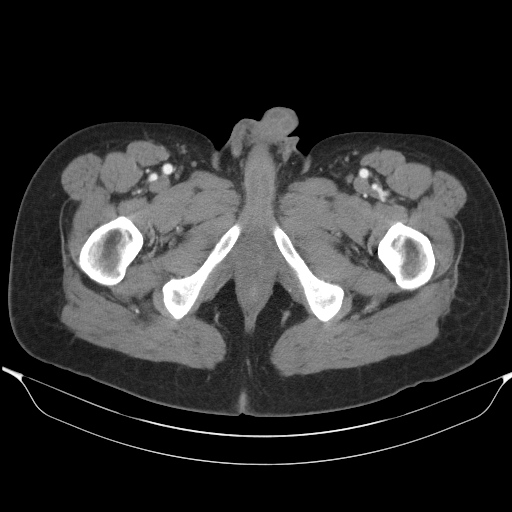
[im 16/56  soft-tissue]
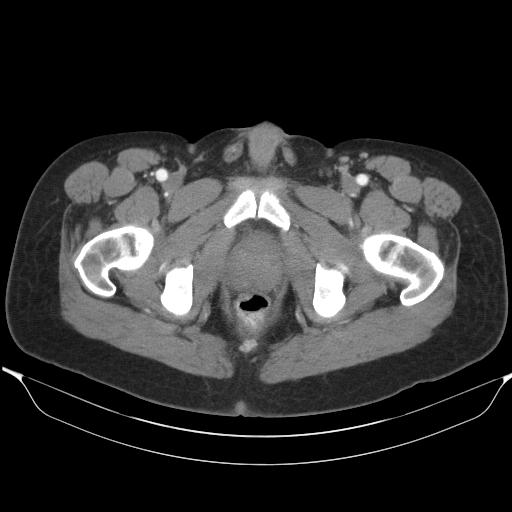
[im 20/56  soft-tissue]
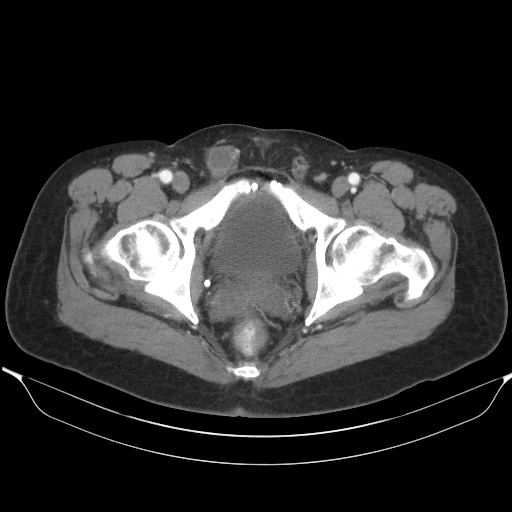
[im 24/56  soft-tissue]
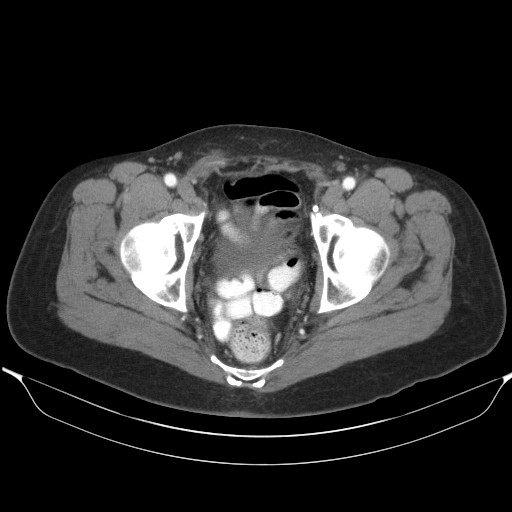
[im 29/56  soft-tissue]
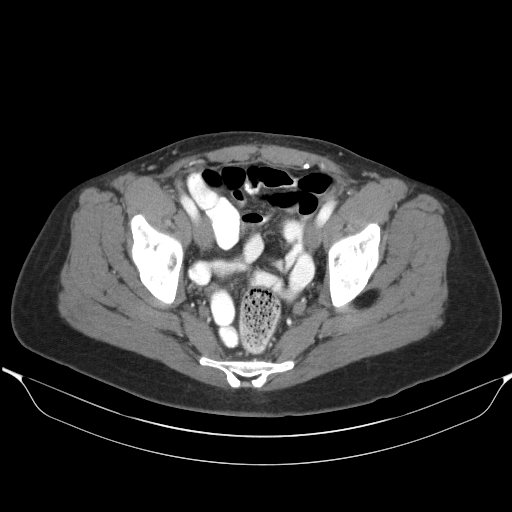
[im 32/56  soft-tissue]
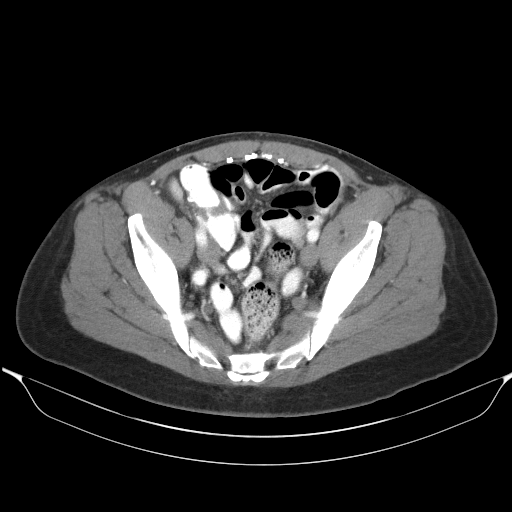
[im 36/56  soft-tissue]
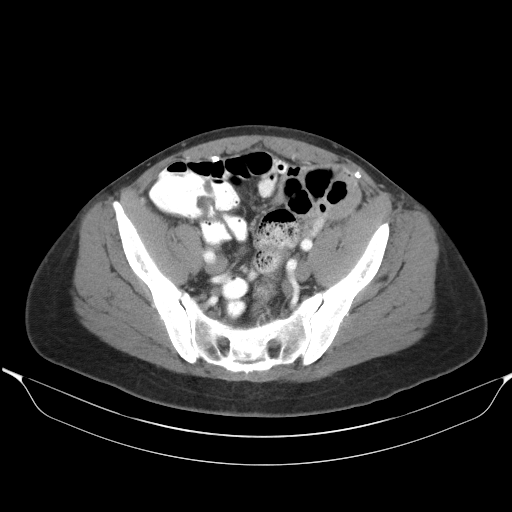
[im 36/56  bone]
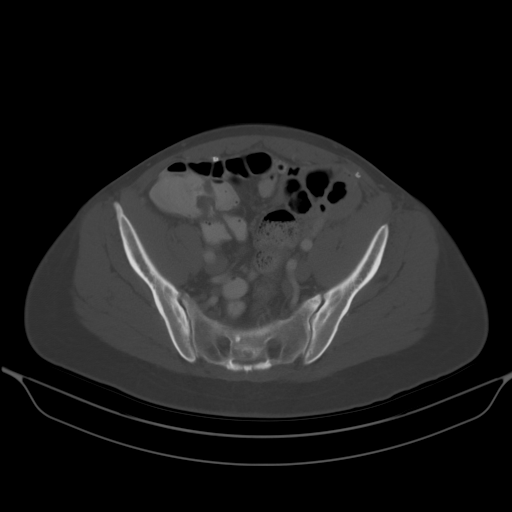
[im 40/56  soft-tissue]
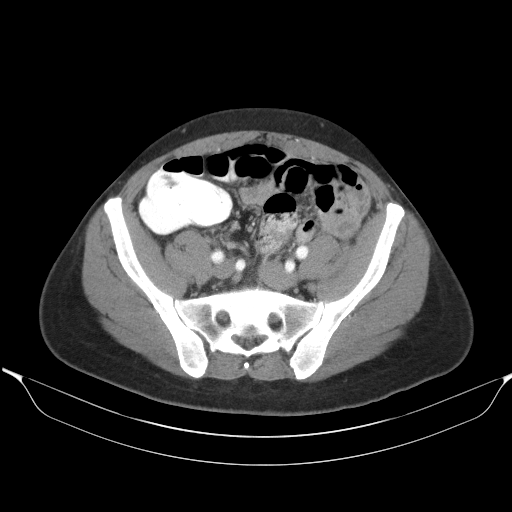
[im 45/56  soft-tissue]
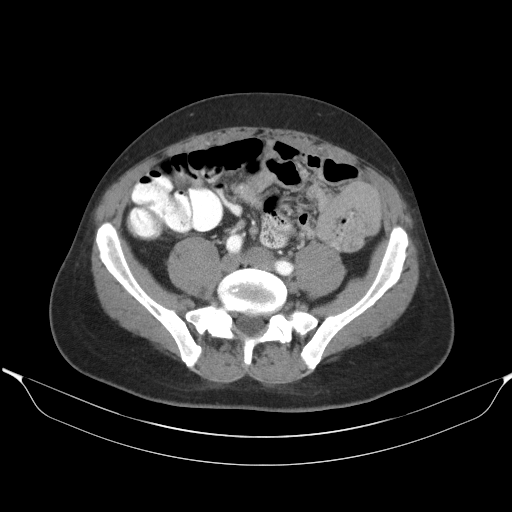
[im 48/56  soft-tissue]
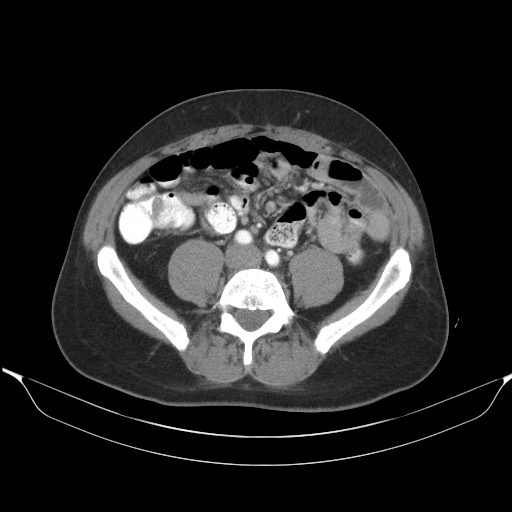
[im 48/56  lung]
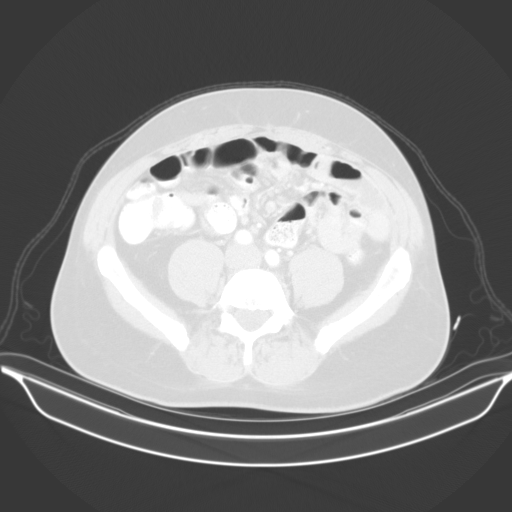
[im 50/56  lung]
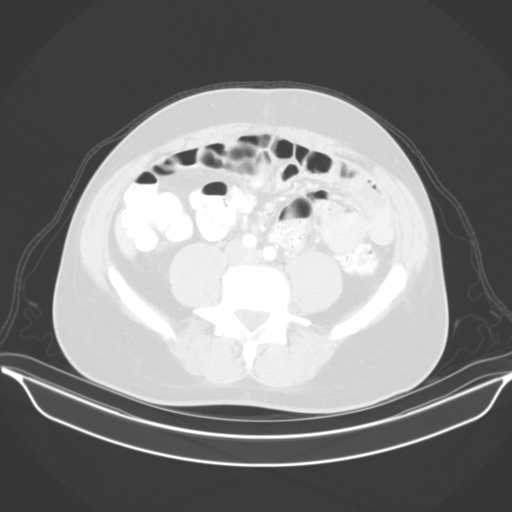
[im 52/56  soft-tissue]
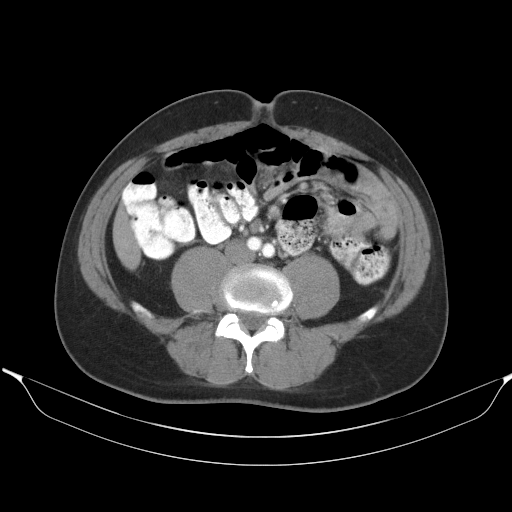
[im 52/56  lung]
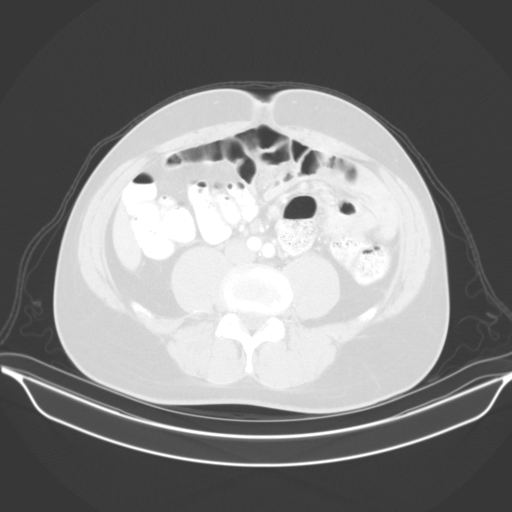
[im 54/56  lung]
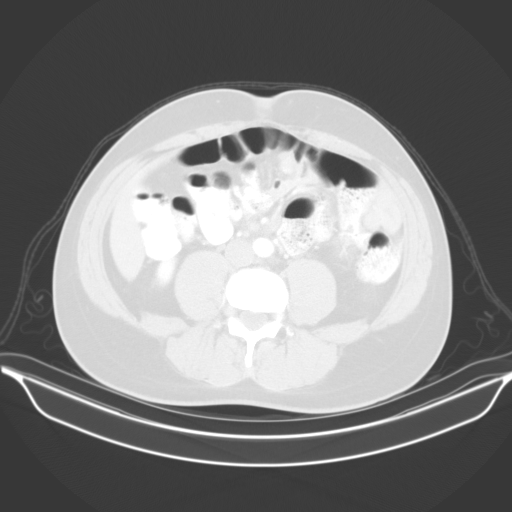

[15 of 32 positions shown; findings below may reference images not displayed]

FINDINGS: Surgical mesh is seen within the suprapubic anterior abdominal wall
soft tissues. No evidence of recurrent hernia. An ovoid
low-attenuation lesion with mild peripheral enhancement is seen
within the right inguinal canal which measures approximately 2.8 x
2.3 by 1.9 cm. This measures higher than simple fluid and could
represent a postoperative fluid collection or other cystic mass.
Both testicles are visualized inferiorly within the scrotum.

No intrapelvic soft tissue masses or lymphadenopathy identified.
Visualized pelvic bowel loops are unremarkable in appearance. No
evidence of inflammatory process or abnormal fluid collections.
IMPRESSION: Nonspecific ovoid low-attenuation lesion with peripheral enhancement
in the right inguinal canal measuring 2.8 cm. Differential diagnosis
includes postop fluid collection or other cystic mass.

Surgical mesh in suprapubic abdominal wall soft tissues. No evidence
of recurrent hernia.

## 2015-05-14 ENCOUNTER — Other Ambulatory Visit: Payer: Self-pay | Admitting: Gastroenterology

## 2015-05-15 ENCOUNTER — Other Ambulatory Visit: Payer: Self-pay | Admitting: Gastroenterology

## 2015-05-26 ENCOUNTER — Other Ambulatory Visit: Payer: Self-pay | Admitting: General Surgery

## 2015-07-13 ENCOUNTER — Encounter (HOSPITAL_COMMUNITY): Payer: Self-pay | Admitting: *Deleted

## 2015-07-23 NOTE — Anesthesia Preprocedure Evaluation (Addendum)
Anesthesia Evaluation  Patient identified by MRN, date of birth, ID band Patient awake    Reviewed: Allergy & Precautions, NPO status , Patient's Chart, lab work & pertinent test results  Airway Mallampati: II   Neck ROM: Full    Dental  (+) Teeth Intact, Dental Advisory Given   Pulmonary neg pulmonary ROS,    breath sounds clear to auscultation       Cardiovascular hypertension, Pt. on medications negative cardio ROS   Rhythm:Regular     Neuro/Psych Depression negative neurological ROS  negative psych ROS   GI/Hepatic Neg liver ROS, GERD  Medicated,  Endo/Other  negative endocrine ROS  Renal/GU negative Renal ROS  negative genitourinary   Musculoskeletal negative musculoskeletal ROS (+)   Abdominal (+)  Abdomen: soft.    Peds negative pediatric ROS (+)  Hematology negative hematology ROS (+)   Anesthesia Other Findings   Reproductive/Obstetrics negative OB ROS                            Anesthesia Physical Anesthesia Plan  ASA: II  Anesthesia Plan: MAC   Post-op Pain Management:    Induction: Intravenous  Airway Management Planned: Nasal Cannula  Additional Equipment:   Intra-op Plan:   Post-operative Plan:   Informed Consent: I have reviewed the patients History and Physical, chart, labs and discussed the procedure including the risks, benefits and alternatives for the proposed anesthesia with the patient or authorized representative who has indicated his/her understanding and acceptance.     Plan Discussed with:   Anesthesia Plan Comments:         Anesthesia Quick Evaluation

## 2015-07-28 ENCOUNTER — Encounter (HOSPITAL_COMMUNITY): Admission: RE | Payer: Self-pay | Source: Ambulatory Visit

## 2015-07-28 ENCOUNTER — Encounter (HOSPITAL_COMMUNITY): Payer: Self-pay | Admitting: *Deleted

## 2015-07-28 ENCOUNTER — Ambulatory Visit (HOSPITAL_COMMUNITY)
Admission: RE | Admit: 2015-07-28 | Payer: BLUE CROSS/BLUE SHIELD | Source: Ambulatory Visit | Admitting: Gastroenterology

## 2015-07-28 ENCOUNTER — Encounter (HOSPITAL_COMMUNITY)
Admission: RE | Disposition: A | Discharge status: Home / Self Care | Payer: Self-pay | Source: Ambulatory Visit | Attending: Gastroenterology

## 2015-07-28 ENCOUNTER — Ambulatory Visit (HOSPITAL_COMMUNITY): Payer: BLUE CROSS/BLUE SHIELD | Admitting: Anesthesiology

## 2015-07-28 ENCOUNTER — Ambulatory Visit (HOSPITAL_COMMUNITY)
Admission: RE | Admit: 2015-07-28 | Discharge: 2015-07-28 | Disposition: A | Discharge status: Home / Self Care | Payer: BLUE CROSS/BLUE SHIELD | Source: Ambulatory Visit | Attending: Gastroenterology | Admitting: Gastroenterology

## 2015-07-28 DIAGNOSIS — F329 Major depressive disorder, single episode, unspecified: Secondary | ICD-10-CM | POA: Insufficient documentation

## 2015-07-28 DIAGNOSIS — I10 Essential (primary) hypertension: Secondary | ICD-10-CM | POA: Insufficient documentation

## 2015-07-28 DIAGNOSIS — K219 Gastro-esophageal reflux disease without esophagitis: Secondary | ICD-10-CM | POA: Insufficient documentation

## 2015-07-28 DIAGNOSIS — N400 Enlarged prostate without lower urinary tract symptoms: Secondary | ICD-10-CM | POA: Diagnosis not present

## 2015-07-28 DIAGNOSIS — Z79899 Other long term (current) drug therapy: Secondary | ICD-10-CM | POA: Diagnosis not present

## 2015-07-28 DIAGNOSIS — Z8042 Family history of malignant neoplasm of prostate: Secondary | ICD-10-CM | POA: Insufficient documentation

## 2015-07-28 DIAGNOSIS — Z1211 Encounter for screening for malignant neoplasm of colon: Secondary | ICD-10-CM | POA: Diagnosis not present

## 2015-07-28 HISTORY — DX: Anemia, unspecified: D64.9

## 2015-07-28 HISTORY — PX: COLONOSCOPY WITH PROPOFOL: SHX5780

## 2015-07-28 HISTORY — DX: Headache, unspecified: R51.9

## 2015-07-28 HISTORY — DX: Plantar fascial fibromatosis: M72.2

## 2015-07-28 HISTORY — DX: Dorsalgia, unspecified: M54.9

## 2015-07-28 HISTORY — DX: Headache: R51

## 2015-07-28 SURGERY — COLONOSCOPY WITH PROPOFOL
Anesthesia: Monitor Anesthesia Care

## 2015-07-28 MED ORDER — SODIUM CHLORIDE 0.9 % IV SOLN: INTRAVENOUS | Status: DC

## 2015-07-28 MED ORDER — PROPOFOL 10 MG/ML IV BOLUS
INTRAVENOUS | Status: AC
  Filled 2015-07-28: qty 40

## 2015-07-28 MED ORDER — LACTATED RINGERS IV SOLN
INTRAVENOUS | Status: DC
  Administered 2015-07-28: 1000 mL via INTRAVENOUS

## 2015-07-28 MED ORDER — FENTANYL CITRATE (PF) 100 MCG/2ML IJ SOLN: 25.0000 ug | INTRAMUSCULAR | Status: DC | PRN

## 2015-07-28 MED ORDER — PROMETHAZINE HCL 25 MG/ML IJ SOLN: 6.2500 mg | INTRAMUSCULAR | Status: DC | PRN

## 2015-07-28 MED ORDER — PROPOFOL 500 MG/50ML IV EMUL
INTRAVENOUS | Status: DC | PRN
  Administered 2015-07-28 (×2): 30 mg via INTRAVENOUS

## 2015-07-28 MED ORDER — MEPERIDINE HCL 100 MG/ML IJ SOLN: 6.2500 mg | INTRAMUSCULAR | Status: DC | PRN

## 2015-07-28 MED ORDER — PROPOFOL 500 MG/50ML IV EMUL
INTRAVENOUS | Status: DC | PRN
  Administered 2015-07-28: 100 ug/kg/min via INTRAVENOUS

## 2015-07-28 SURGICAL SUPPLY — 22 items

## 2015-07-28 NOTE — Op Note (Signed)
Procedure: Screening colonoscopy. 03/25/2005 normal screening colonoscopy and diagnostic esophagogastroduodenoscopy with small bowel biopsies performed.  Endoscopist: Earle Gell  Premedication: Propofol administered by anesthesia  Procedure: The patient was placed in the left lateral decubitus position. Anal inspection and digital rectal exam were normal. The Pentax pediatric colonoscope was introduced into the rectum and advanced to the cecum. A normal-appearing appendiceal orifice and ileocecal valve were identified. Colonic preparation for the exam today was good. Withdrawal time was 11 minutes  Rectum. Normal. Retroflexed view of the distal rectum was normal  Sigmoid colon and descending colon. Normal  Splenic flexure. Normal  Transverse colon. Normal  Hepatic flexure. Normal  Ascending colon. Normal  Cecum and ileocecal valve. Normal  Assessment: Normal screening colonoscopy  Recommendation: Schedule repeat screening colonoscopy in 10 years

## 2015-07-28 NOTE — Anesthesia Postprocedure Evaluation (Signed)
Anesthesia Post Note  Patient: Brent Massey  Procedure(s) Performed: Procedure(s) (LRB): COLONOSCOPY WITH PROPOFOL (N/A)  Patient location during evaluation: PACU Anesthesia Type: MAC Level of consciousness: awake and alert Pain management: pain level controlled Vital Signs Assessment: post-procedure vital signs reviewed and stable Respiratory status: spontaneous breathing, nonlabored ventilation, respiratory function stable and patient connected to nasal cannula oxygen Cardiovascular status: stable and blood pressure returned to baseline Anesthetic complications: no    Last Vitals:  Filed Vitals:   07/28/15 0920 07/28/15 0925  BP:  116/72  Pulse: 61 54  Temp: 36.4 C   Resp: 18 15    Last Pain: There were no vitals filed for this visit.               Kollins Fenter

## 2015-07-28 NOTE — Transfer of Care (Signed)
Immediate Anesthesia Transfer of Care Note  Patient: Brent Massey  Procedure(s) Performed: Procedure(s): COLONOSCOPY WITH PROPOFOL (N/A)  Patient Location: PACU  Anesthesia Type:MAC  Level of Consciousness: sedated, patient cooperative and responds to stimulation  Airway & Oxygen Therapy: Patient Spontanous Breathing and Patient connected to face mask oxygen  Post-op Assessment: Report given to RN and Post -op Vital signs reviewed and stable  Post vital signs: Reviewed and stable  Last Vitals:  Filed Vitals:   07/28/15 0815  BP: 148/78  Pulse: 63  Temp: 37.1 C  Resp: 13    Complications: No apparent anesthesia complications

## 2015-07-28 NOTE — Discharge Instructions (Signed)

## 2015-07-28 NOTE — H&P (Signed)
  Procedure: Screening colonoscopy. Normal screening colonoscopy and diagnostic esophagogastroduodenoscopy with small bowel biopsies performed on 03/25/2005  History: The patient is a 59 year old male born [birth year]. He is scheduled to undergo a screening colonoscopy today.  Past medical history: Hypertension. Depression. Seasonal rhinitis. Benign prostatic hypertrophy. Gastroesophageal reflux. Bilateral inguinal hernia surgery. Wrist fracture surgery.  Exam: The patient is alert and lying comfortably on the endoscopy stretcher. Abdomen is soft and nontender to palpation. Lungs are clear to auscultation. Cardiac exam reveals a regular rhythm.  Plan: Proceed with screening colonoscopy

## 2015-07-29 ENCOUNTER — Encounter (HOSPITAL_COMMUNITY): Payer: Self-pay | Admitting: Gastroenterology

## 2015-09-22 ENCOUNTER — Ambulatory Visit
Admission: RE | Admit: 2015-09-22 | Discharge: 2015-09-22 | Disposition: A | Discharge status: Home / Self Care | Payer: BLUE CROSS/BLUE SHIELD | Source: Ambulatory Visit | Attending: Internal Medicine | Admitting: Internal Medicine

## 2015-09-22 ENCOUNTER — Other Ambulatory Visit: Payer: Self-pay | Admitting: Internal Medicine

## 2015-09-22 DIAGNOSIS — R609 Edema, unspecified: Secondary | ICD-10-CM

## 2015-09-22 DIAGNOSIS — R52 Pain, unspecified: Secondary | ICD-10-CM

## 2015-09-25 ENCOUNTER — Other Ambulatory Visit (HOSPITAL_COMMUNITY): Payer: Self-pay | Admitting: Internal Medicine

## 2015-09-25 DIAGNOSIS — R600 Localized edema: Secondary | ICD-10-CM

## 2015-09-28 ENCOUNTER — Ambulatory Visit (HOSPITAL_COMMUNITY): Payer: BLUE CROSS/BLUE SHIELD | Attending: Cardiovascular Disease

## 2015-09-28 ENCOUNTER — Other Ambulatory Visit: Payer: Self-pay

## 2015-09-28 DIAGNOSIS — I351 Nonrheumatic aortic (valve) insufficiency: Secondary | ICD-10-CM | POA: Insufficient documentation

## 2015-09-28 DIAGNOSIS — R600 Localized edema: Secondary | ICD-10-CM | POA: Insufficient documentation

## 2015-09-28 DIAGNOSIS — I10 Essential (primary) hypertension: Secondary | ICD-10-CM | POA: Insufficient documentation

## 2015-10-19 ENCOUNTER — Other Ambulatory Visit: Payer: Self-pay | Admitting: *Deleted

## 2015-10-19 DIAGNOSIS — R600 Localized edema: Secondary | ICD-10-CM

## 2015-10-19 DIAGNOSIS — I872 Venous insufficiency (chronic) (peripheral): Secondary | ICD-10-CM

## 2015-11-20 ENCOUNTER — Encounter: Payer: Self-pay | Admitting: Vascular Surgery

## 2015-11-26 ENCOUNTER — Ambulatory Visit (HOSPITAL_COMMUNITY)
Admission: RE | Admit: 2015-11-26 | Discharge: 2015-11-26 | Disposition: A | Discharge status: Home / Self Care | Payer: BLUE CROSS/BLUE SHIELD | Source: Ambulatory Visit | Attending: Vascular Surgery | Admitting: Vascular Surgery

## 2015-11-26 ENCOUNTER — Ambulatory Visit (INDEPENDENT_AMBULATORY_CARE_PROVIDER_SITE_OTHER): Payer: BLUE CROSS/BLUE SHIELD | Admitting: Vascular Surgery

## 2015-11-26 ENCOUNTER — Encounter: Payer: Self-pay | Admitting: Vascular Surgery

## 2015-11-26 VITALS — BP 126/80 | HR 68 | Temp 97.7°F | Resp 14 | Ht 75.0 in | Wt 207.0 lb

## 2015-11-26 DIAGNOSIS — R600 Localized edema: Secondary | ICD-10-CM | POA: Insufficient documentation

## 2015-11-26 DIAGNOSIS — I872 Venous insufficiency (chronic) (peripheral): Secondary | ICD-10-CM | POA: Insufficient documentation

## 2015-11-26 DIAGNOSIS — I83893 Varicose veins of bilateral lower extremities with other complications: Secondary | ICD-10-CM

## 2015-11-26 NOTE — Progress Notes (Signed)
Referring Physician: Wenda Low, MD  Patient name: Brent Massey MRN: [id number] DOB: 1955/09/23 Sex: male  REASON FOR CONSULT: Bilateral leg swelling  HPI: Brent Massey is a 60 y.o. male,  sent for evaluation of bilateral lower extremity swelling. He states that the swelling started fairly acutely inflamed February after starting a Lyrica prescription. The swelling has persisted despite stopping the Lyrica. He denies prior history of DVT. He does have a job where he is standing on his feet all day. He states his legs swell progressively during the day but are improved somewhat in the morning but never completely resolved. He had an echocardiogram in February which was normal. He has been wearing knee-high compression stockings which helped the swelling in his feet but were very painful at the knee area due to increased swelling higher in the leg. He denies any prior trauma or operations on his legs. He is currently on Lasix. He denies family history of varicose veins. He does have a family history of an abdominal aortic aneurysm in his father. Other medical problems include history of decreased white cell count and anemia which is chronic. He also has a history of hypertension.  Past Medical History  Diagnosis Date  . Leukocytopenia     saw oncology first ,now followed by PCP- Dr. Lysle Rubens.  . Depression   . Hypertension   . BPH (benign prostatic hyperplasia)   . GERD (gastroesophageal reflux disease)   . Neutropenia, unspecified ([place])   . Allergy   . Headache     migraines as teenager  . Plantar fascia syndrome     bilateral- intermittent  . 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 Back pain     lower back "pinched nerve"-being evaluated- numbness left foot toes.   Past Surgical History  Procedure Laterality Date  . Wrist fracture surgery    . Hernia repair    . Fracture surgery    . Inguinal exploration Right     "soft tissue mass removal" 05-26-15 Surgical center  [place]  . Eye surgery      laser surgery for retina tear  . Colonoscopy with propofol N/A 07/28/2015    Procedure: COLONOSCOPY WITH PROPOFOL;  Surgeon: Garlan Fair, MD;  Location: [facility];  Service: Endoscopy;  Laterality: N/A;    Family History  Problem Relation Age of Onset  . Depression Sister   . Depression Brother   . Prostate cancer Brother     SOCIAL HISTORY: Social History   Social History  . Marital Status: Married    Spouse Name: N/A  . Number of Children: N/A  . Years of Education: N/A   Occupational History  . Not on file.   Social History Main Topics  . Smoking status: Never Smoker   . Smokeless tobacco: Not on file  . Alcohol Use: No  . Drug Use: No  . Sexual Activity: Yes   Other Topics Concern  . Not on file   Social History Narrative    No Known Allergies  Current Outpatient Prescriptions  Medication Sig Dispense Refill  . escitalopram (LEXAPRO) 20 MG tablet Take 20 mg by mouth daily.    . furosemide (LASIX) 40 MG tablet Take 40 mg by mouth daily.    . Ibuprofen-Famotidine (DUEXIS) 800-26.6 MG TABS Take 1 tablet by mouth every 8 (eight) hours as needed (for pain and back swelling).    Marland Kitchen loratadine (CLARITIN) 10 MG tablet Take 10 mg by mouth  daily as needed for allergies. For allergies.    . mometasone (NASONEX) 50 MCG/ACT nasal spray Place 2 sprays into the nose daily as needed (allergies).     . Multiple Vitamin (MULTIVITAMIN WITH MINERALS) TABS Take 1 tablet by mouth daily.    . potassium chloride (K-DUR,KLOR-CON) 10 MEQ tablet Take 10 mEq by mouth 2 (two) times daily.    Marland Kitchen PRESCRIPTION MEDICATION Apply 1 application topically 4 (four) times daily as needed (swelling). C anti-inflammatory cream; apply to bottom of feet/heels for plantar fasciitis    . Probiotic Product (PROBIOTIC FORMULA) CAPS Take 1 capsule by mouth daily.     . saw palmetto 160 MG capsule Take 160 mg by mouth daily.     No current facility-administered  medications for this visit.    ROS:   General:  No weight loss, Fever, chills  HEENT: No recent headaches, no nasal bleeding, no visual changes, no sore throat  Neurologic: No dizziness, blackouts, seizures. No recent symptoms of stroke or mini- stroke. No recent episodes of slurred speech, or temporary blindness.  Cardiac: No recent episodes of chest pain/pressure, no shortness of breath at rest.  No shortness of breath with exertion.  Denies history of atrial fibrillation or irregular heartbeat  Vascular: No history of rest pain in feet.  No history of claudication.  No history of non-healing ulcer, No history of DVT   Pulmonary: No home oxygen, no productive cough, no hemoptysis,  No asthma or wheezing  Musculoskeletal:  [ ]  Arthritis, [ ]  Low back pain,  [ ]  Joint pain  Hematologic:No history of hypercoagulable state.  No history of easy bleeding.  No history of anemia  Gastrointestinal: No hematochezia or melena,  No gastroesophageal reflux, no trouble swallowing  Urinary: [ ]  chronic Kidney disease, [ ]  on HD - [ ]  MWF or [ ]  TTHS, [ ]  Burning with urination, [ ]  Frequent urination, [ ]  Difficulty urinating;   Skin: No rashes  Psychological: No history of anxiety,  No history of depression   Physical Examination  Filed Vitals:   11/26/15 1300  BP: 126/80  Pulse: 68  Temp: 97.7 F (36.5 C)  TempSrc: Oral  Resp: 14  Height: 6\' 3"  (1.905 m)  Weight: 207 lb (93.895 kg)  SpO2: 100%    Body mass index is 25.87 kg/(m^2).  General:  Alert and oriented, no acute distress HEENT: Normal Neck: No bruit or JVD Pulmonary: Clear to auscultation bilaterally Cardiac: Regular Rate and Rhythm without murmur Abdomen: Soft, non-tender, non-distended, no mass Skin: No rash Extremity Pulses:  2+ radial, brachial, femoral, dorsalis pedis, posterior tibial pulses bilaterally Musculoskeletal: No deformity 1-2+ edema extending from the knee down into the foot without thigh  involvement no obvious surface varicosities  Neurologic: Upper and lower extremity motor 5/5 and symmetric  DATA:  Patient had a venous reflux exam today. He was noted to have pulsatile flow in the veins bilaterally. He also had deep vein reflux bilaterally as well as the left saphenofemoral junction and the entirety of the right greater saphenous vein. The right greater saphenous vein was 5-8 mm in diameter.  ASSESSMENT:  Bilateral leg swelling fairly acute onset with evidence of venous reflux. However, his symptoms are not completely consistent with chronic reflux. In light of this we will obtain a CT scan of the abdomen and pelvis to rule out venous obstruction or mass is a reason for his lower extremity swelling. If the CT scan shows no significant findings, we will  consider whether or not he would be a candidate for laser ablation of the right greater saphenous vein to give him some symptomatic relief on the right side. In the meanwhile he was given bilateral compression stocking prescription 20-30 mm thigh-high. The patient will return in a few weeks after his CT scan.   PLAN:  See above   Ruta Hinds, MD [facility] Office: [phone] Pager: [phone]

## 2015-11-27 NOTE — Addendum Note (Signed)
Addended by: Mena Goes on: 11/27/2015 10:36 AM   Modules accepted: Orders

## 2015-11-30 ENCOUNTER — Telehealth: Payer: Self-pay | Admitting: Vascular Surgery

## 2015-11-30 NOTE — Telephone Encounter (Signed)
Spoke with pt. Due to work schedule, changed pt's appts: CT Venogram abd/pelvis at [facility] 12/10/15 12:40 pm.  Appointment with [facility] after. Pt verbalized understanding.

## 2015-11-30 NOTE — Telephone Encounter (Signed)
error 

## 2015-12-02 ENCOUNTER — Encounter: Payer: Self-pay | Admitting: Vascular Surgery

## 2015-12-02 ENCOUNTER — Other Ambulatory Visit: Payer: BLUE CROSS/BLUE SHIELD

## 2015-12-03 ENCOUNTER — Ambulatory Visit: Payer: BLUE CROSS/BLUE SHIELD | Admitting: Vascular Surgery

## 2015-12-09 DIAGNOSIS — M71572 Other bursitis, not elsewhere classified, left ankle and foot: Secondary | ICD-10-CM | POA: Diagnosis not present

## 2015-12-09 DIAGNOSIS — M722 Plantar fascial fibromatosis: Secondary | ICD-10-CM | POA: Diagnosis not present

## 2015-12-10 ENCOUNTER — Encounter: Payer: Self-pay | Admitting: Vascular Surgery

## 2015-12-10 ENCOUNTER — Ambulatory Visit
Admission: RE | Admit: 2015-12-10 | Discharge: 2015-12-10 | Disposition: A | Discharge status: Home / Self Care | Payer: BLUE CROSS/BLUE SHIELD | Source: Ambulatory Visit | Attending: Vascular Surgery | Admitting: Vascular Surgery

## 2015-12-10 ENCOUNTER — Ambulatory Visit (INDEPENDENT_AMBULATORY_CARE_PROVIDER_SITE_OTHER): Payer: BLUE CROSS/BLUE SHIELD | Admitting: Vascular Surgery

## 2015-12-10 VITALS — BP 129/72 | HR 72 | Temp 99.4°F | Resp 16 | Ht 75.0 in | Wt 214.0 lb

## 2015-12-10 DIAGNOSIS — I83893 Varicose veins of bilateral lower extremities with other complications: Secondary | ICD-10-CM | POA: Diagnosis not present

## 2015-12-10 DIAGNOSIS — N281 Cyst of kidney, acquired: Secondary | ICD-10-CM | POA: Diagnosis not present

## 2015-12-10 MED ORDER — IOPAMIDOL (ISOVUE-370) INJECTION 76%
100.0000 mL | Freq: Once | INTRAVENOUS | Status: AC | PRN
Start: 2015-12-10 — End: 2015-12-10
  Administered 2015-12-10: 100 mL via INTRAVENOUS

## 2015-12-10 NOTE — Progress Notes (Addendum)
Referring Physician: Wenda Low, MD  Patient name: Brent Massey     MRN: [id number]        DOB: 1956/06/20         Sex: male  REASON FOR CONSULT: Bilateral leg swelling  HPI: Brent Massey is a 60 y.o. male,  sent for evaluation of bilateral lower extremity swelling. He states that the swelling started fairly acutely inflamed February after starting a Lyrica prescription. The swelling has persisted despite stopping the Lyrica. He denies prior history of DVT. He does have a job where he is standing on his feet all day. He states his legs swell progressively during the day but are improved somewhat in the morning but never completely resolved. He had an echocardiogram in February which was normal. He has been wearing knee-high compression stockings which helped the swelling in his feet but were very painful at the knee area due to increased swelling higher in the leg. He denies any prior trauma or operations on his legs. He is currently on Lasix. He denies family history of varicose veins. He does have a family history of an abdominal aortic aneurysm in his father. Other medical problems include history of decreased white cell count and anemia which is chronic. He also has a history of hypertension.  He returns today for follow-up after his recent CT scan.    Past Medical History   Diagnosis  Date   .  Leukocytopenia         saw oncology first ,now followed by PCP- Dr. Lysle Rubens.   .  Depression     .  Hypertension     .  BPH (benign prostatic hyperplasia)     .  GERD (gastroesophageal reflux disease)     .  Neutropenia, unspecified ([place])     .  Allergy     .  Headache         migraines as teenager   .  Plantar fascia syndrome         bilateral- intermittent   .  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  Back pain         lower back "pinched nerve"-being evaluated- numbness left foot toes.    Past Surgical History   Procedure  Laterality  Date   .  Wrist fracture  surgery       .  Hernia repair       .  Fracture surgery       .  Inguinal exploration  Right         "soft tissue mass removal" 05-26-15 [facility] [place]   .  Eye surgery           laser surgery for retina tear   .  Colonoscopy with propofol  N/A  07/28/2015       Procedure: COLONOSCOPY WITH PROPOFOL;  Surgeon: Garlan Fair, MD;  Location: [facility];  Service: Endoscopy;  Laterality: N/A;       Family History   Problem  Relation  Age of Onset   .  Depression  Sister     .  Depression  Brother     .  Prostate cancer  Brother       SOCIAL HISTORY: Social History      Social History   .  Marital Status:  Married       Spouse Name:  N/A   .  Number  of Children:  N/A   .  Years of Education:  N/A      Occupational History   .  Not on file.      Social History Main Topics   .  Smoking status:  Never Smoker    .  Smokeless tobacco:  Not on file   .  Alcohol Use:  No   .  Drug Use:  No   .  Sexual Activity:  Yes      Other Topics  Concern   .  Not on file      Social History Narrative     No Known Allergies    Current Outpatient Prescriptions   Medication  Sig  Dispense  Refill   .  escitalopram (LEXAPRO) 20 MG tablet  Take 20 mg by mouth daily.       .  furosemide (LASIX) 40 MG tablet  Take 40 mg by mouth daily.       .  Ibuprofen-Famotidine (DUEXIS) 800-26.6 MG TABS  Take 1 tablet by mouth every 8 (eight) hours as needed (for pain and back swelling).       Marland Kitchen  loratadine (CLARITIN) 10 MG tablet  Take 10 mg by mouth daily as needed for allergies. For allergies.       .  mometasone (NASONEX) 50 MCG/ACT nasal spray  Place 2 sprays into the nose daily as needed (allergies).        .  Multiple Vitamin (MULTIVITAMIN WITH MINERALS) TABS  Take 1 tablet by mouth daily.       .  potassium chloride (K-DUR,KLOR-CON) 10 MEQ tablet  Take 10 mEq by mouth 2 (two) times daily.       Marland Kitchen  PRESCRIPTION MEDICATION  Apply 1 application topically 4 (four) times daily as needed  (swelling). C anti-inflammatory cream; apply to bottom of feet/heels for plantar fasciitis       .  Probiotic Product (PROBIOTIC FORMULA) CAPS  Take 1 capsule by mouth daily.        .  saw palmetto 160 MG capsule  Take 160 mg by mouth daily.          No current facility-administered medications for this visit.     ROS:    General:  No weight loss, Fever, chills  HEENT: No recent headaches, no nasal bleeding, no visual changes, no sore throat  Neurologic: No dizziness, blackouts, seizures. No recent symptoms of stroke or mini- stroke. No recent episodes of slurred speech, or temporary blindness.  Cardiac: No recent episodes of chest pain/pressure, no shortness of breath at rest.  No shortness of breath with exertion.  Denies history of atrial fibrillation or irregular heartbeat  Vascular: No history of rest pain in feet.  No history of claudication.  No history of non-healing ulcer, No history of DVT    Pulmonary: No home oxygen, no productive cough, no hemoptysis,  No asthma or wheezing  Musculoskeletal:  [ ]  Arthritis, [ ]  Low back pain,  [ ]  Joint pain  Hematologic:No history of hypercoagulable state.  No history of easy bleeding.  No history of anemia  Gastrointestinal: No hematochezia or melena,  No gastroesophageal reflux, no trouble swallowing  Urinary: [ ]  chronic Kidney disease, [ ]  on HD - [ ]  MWF or [ ]  TTHS, [ ]  Burning with urination, [ ]  Frequent urination, [ ]  Difficulty urinating;    Skin: No rashes  Psychological: No history of anxiety,  No history of depression  Physical Examination    Filed Vitals:   12/10/15 1441  BP: 129/72  Pulse: 72  Temp: 99.4 F (37.4 C)  TempSrc: Oral  Resp: 16  Height: 6\' 3"  (1.905 m)  Weight: 214 lb (97.07 kg)  SpO2: 98%    General:  Alert and oriented, no acute distress HEENT: Normal Abdomen: Soft, non-tender, non-distended, no mass Skin: No rash Extremity Pulses:  2+ radial, brachial, femoral, dorsalis pedis,  posterior tibial pulses bilaterally Musculoskeletal: No deformity 1-2+ edema extending from the knee down into the foot without thigh involvement A few scattered surface varicosities      Neurologic: Upper and lower extremity motor 5/5 and symmetric  DATA:  Patient had a prior venous reflux exam. He was noted to have pulsatile flow in the veins bilaterally. He also had deep vein reflux bilaterally as well as the left saphenofemoral junction and the entirety of the right greater saphenous vein. The right greater saphenous vein was 5-8 mm in diameter.  CT scan of abdomen and pelvis images are reviewed today. This shows no evidence of venous outflow obstruction no obvious pelvic mass to explain his swollen legs.  ASSESSMENT:  CT scan negative for pelvic venous outflow obstruction. His symptoms seem to be a combination of deep and superficial venous reflux. I offered him laser ablation of the right greater saphenous vein today. However, this will probably not completely alleviate his symptoms but may improving somewhat. Will decide if he wishes to consider this in the future. Otherwise the mainstay of therapy for him is going to be continued compression therapy.  PLAN:  agent will follow-up on as-needed basis if he was to consider laser ablation some point future.   Ruta Hinds, MD [facility] Office: [phone] Pager: [phone]

## 2015-12-23 DIAGNOSIS — M722 Plantar fascial fibromatosis: Secondary | ICD-10-CM | POA: Diagnosis not present

## 2015-12-23 DIAGNOSIS — M71572 Other bursitis, not elsewhere classified, left ankle and foot: Secondary | ICD-10-CM | POA: Diagnosis not present

## 2016-01-06 DIAGNOSIS — I10 Essential (primary) hypertension: Secondary | ICD-10-CM | POA: Diagnosis not present

## 2016-01-06 DIAGNOSIS — I872 Venous insufficiency (chronic) (peripheral): Secondary | ICD-10-CM | POA: Diagnosis not present

## 2016-01-06 DIAGNOSIS — D72819 Decreased white blood cell count, unspecified: Secondary | ICD-10-CM | POA: Diagnosis not present

## 2016-01-06 DIAGNOSIS — R7309 Other abnormal glucose: Secondary | ICD-10-CM | POA: Diagnosis not present

## 2016-01-06 DIAGNOSIS — F324 Major depressive disorder, single episode, in partial remission: Secondary | ICD-10-CM | POA: Diagnosis not present

## 2016-02-08 DIAGNOSIS — D729 Disorder of white blood cells, unspecified: Secondary | ICD-10-CM | POA: Diagnosis not present

## 2016-04-04 DIAGNOSIS — Z1389 Encounter for screening for other disorder: Secondary | ICD-10-CM | POA: Diagnosis not present

## 2016-04-04 DIAGNOSIS — Z0000 Encounter for general adult medical examination without abnormal findings: Secondary | ICD-10-CM | POA: Diagnosis not present

## 2016-04-04 DIAGNOSIS — Z1159 Encounter for screening for other viral diseases: Secondary | ICD-10-CM | POA: Diagnosis not present

## 2016-04-04 DIAGNOSIS — Z125 Encounter for screening for malignant neoplasm of prostate: Secondary | ICD-10-CM | POA: Diagnosis not present

## 2016-04-09 DIAGNOSIS — Z23 Encounter for immunization: Secondary | ICD-10-CM | POA: Diagnosis not present

## 2016-05-11 ENCOUNTER — Telehealth: Payer: Self-pay | Admitting: Oncology

## 2016-05-11 ENCOUNTER — Ambulatory Visit (HOSPITAL_BASED_OUTPATIENT_CLINIC_OR_DEPARTMENT_OTHER): Payer: BLUE CROSS/BLUE SHIELD | Admitting: Oncology

## 2016-05-11 VITALS — BP 127/72 | HR 70 | Temp 98.0°F | Resp 18 | Ht 75.0 in | Wt 212.9 lb

## 2016-05-11 DIAGNOSIS — D72819 Decreased white blood cell count, unspecified: Secondary | ICD-10-CM

## 2016-05-11 DIAGNOSIS — D708 Other neutropenia: Secondary | ICD-10-CM | POA: Diagnosis not present

## 2016-05-11 NOTE — Progress Notes (Signed)
Hematology and Oncology Follow Up Visit  Brent Massey [id number] Jul 17, 1956 60 y.o. 05/11/2016 11:19 AM Brent Massey, MDHusain, Brent Ar, MD   Principle Diagnosis: 60 year old gentleman with chronic neutropenia diagnosed in 2012. His workup has been unrevealing for any hematological disorder. Etiology is related to congenital reasons.   Prior Therapy: He is status post bone marrow biopsy in 2012 which showed no major abnormalities.  Current therapy: Observation and surveillance.  Interim History: Brent Massey presents today for a follow-up visit. He is a pleasant gentleman is to follow with Dr. Lamonte Massey for neutropenia. He has not been seen in clinic for few years and was referred back for the same concern. His last visit, he does not report any major changes in his health. He was found to have a scrotal mass that was biopsied in 2016 and it was benign etiology.  He denied any fevers or chills or sweats or constitutional symptoms. His weight is stable and continues to work full time. He denied any lymphadenopathy or petechiae.  He does not report any headaches, blurry vision, syncope or seizures. He does not report any chest pain, palpitation, orthopnea or leg edema. He does not report any cough, wheezing or hemoptysis. He does not report any nausea, vomiting or abdominal pain. He does not report any change in his bowel habits, constipation or diarrhea. He does not report any frequency urgency or hesitancy. Remaining review of systems unremarkable.   Medications: I have reviewed the patient's current medications.  Current Outpatient Prescriptions  Medication Sig Dispense Refill  . escitalopram (LEXAPRO) 20 MG tablet Take 20 mg by mouth daily.    . furosemide (LASIX) 40 MG tablet Take 40 mg by mouth daily.    . Ibuprofen-Famotidine (DUEXIS) 800-26.6 MG TABS Take 1 tablet by mouth every 8 (eight) hours as needed (for pain and back swelling).    Marland Kitchen loratadine (CLARITIN) 10 MG tablet Take 10 mg by mouth  daily as needed for allergies. For allergies.    . mometasone (NASONEX) 50 MCG/ACT nasal spray Place 2 sprays into the nose daily as needed (allergies).     . Multiple Vitamin (MULTIVITAMIN WITH MINERALS) TABS Take 1 tablet by mouth daily.    . potassium chloride (K-DUR,KLOR-CON) 10 MEQ tablet Take 10 mEq by mouth 2 (two) times daily.    Marland Kitchen PRESCRIPTION MEDICATION Apply 1 application topically 4 (four) times daily as needed (swelling). C anti-inflammatory cream; apply to bottom of feet/heels for plantar fasciitis    . Probiotic Product (PROBIOTIC FORMULA) CAPS Take 1 capsule by mouth daily.     . saw palmetto 160 MG capsule Take 160 mg by mouth daily.     No current facility-administered medications for this visit.      Allergies:  Allergies  Allergen Reactions  . Lyrica [Pregabalin]   . Neurontin [Gabapentin]     Past Medical History, Surgical history, Social history, and Family History were reviewed and updated.   Physical Exam: Blood pressure 127/72, pulse 70, temperature 98 F (36.7 C), temperature source Oral, resp. rate 18, height _0  (1.905 m), weight 212 lb 14.4 oz (96.6 kg), SpO2 100 %. ECOG: 0 General appearance: alert and cooperative Head: Normocephalic, without obvious abnormality Neck: no adenopathy Lymph nodes: Cervical, supraclavicular, and axillary nodes normal. Heart:regular rate and rhythm, S1, S2 normal, no murmur, click, rub or gallop Lung:chest clear, no wheezing, rales, normal symmetric air entry, Heart exam - S1, S2 normal, no murmur, no gallop, rate regular Abdomin: soft, non-tender, without masses or organomegaly EXT:no  erythema, induration, or nodules   Lab Results: Lab Results  Component Value Date   WBC 2.6 (L) 12/08/2013   HGB 13.3 12/08/2013   HCT 40.8 12/08/2013   MCV 96.2 12/08/2013   PLT 115 (L) 12/08/2013     Chemistry      Component Value Date/Time   NA 141 12/08/2013 1038   NA 140 07/19/2013 1347   K 3.3 (L) 12/08/2013 1038   K  4.2 07/19/2013 1347   CL 101 12/08/2013 1038   CL 103 07/20/2012 1155   CO2 28 12/08/2013 1038   CO2 28 07/19/2013 1347   BUN 21 12/08/2013 1038   BUN 18.0 07/19/2013 1347   CREATININE 1.25 12/08/2013 1038   CREATININE 1.2 07/19/2013 1347      Component Value Date/Time   CALCIUM 8.7 12/08/2013 1038   CALCIUM 9.0 07/19/2013 1347   ALKPHOS 69 12/08/2013 1038   ALKPHOS 71 07/19/2013 1347   AST 33 12/08/2013 1038   AST 24 07/19/2013 1347   ALT 13 12/08/2013 1038   ALT 14 07/19/2013 1347   BILITOT 0.4 12/08/2013 1038   BILITOT 0.62 07/19/2013 1347         Impression and Plan:  60 year old gentleman with the following issues:  1. Neutropenia chronic in nature dates of back to 2012. His CBC on 02/08/2016 showed a white cell count of 1.6 with a neutrophil percentage close to 20% which is not different from his previous counts in the last 5 years. The etiology of his neutropenia is probably congenital in nature and related to genetic component. His brother has similar issues and no recurrent malignancy had been discovered.  Her management standpoint, I have recommended continued observation and surveillance and I see no need for growth factor support. He does not have any recurrent infections at this time or recent hospitalizations.  2. Infectious prophylaxis: I have recommended exercising commonsense including handwashing and avoiding sick contacts in general.  3. Follow-up: Will be in 6 months.  [facility], MD 9/20/201711:19 AM

## 2016-05-11 NOTE — Telephone Encounter (Signed)
Avs report and appointment schedule given to patient,per 05/11/16 los. °

## 2016-07-26 DIAGNOSIS — H40013 Open angle with borderline findings, low risk, bilateral: Secondary | ICD-10-CM | POA: Diagnosis not present

## 2016-07-26 DIAGNOSIS — R94112 Abnormal visually evoked potential [VEP]: Secondary | ICD-10-CM | POA: Diagnosis not present

## 2016-10-13 DIAGNOSIS — D72819 Decreased white blood cell count, unspecified: Secondary | ICD-10-CM | POA: Diagnosis not present

## 2016-10-13 DIAGNOSIS — I10 Essential (primary) hypertension: Secondary | ICD-10-CM | POA: Diagnosis not present

## 2016-10-13 DIAGNOSIS — I872 Venous insufficiency (chronic) (peripheral): Secondary | ICD-10-CM | POA: Diagnosis not present

## 2016-10-13 DIAGNOSIS — F324 Major depressive disorder, single episode, in partial remission: Secondary | ICD-10-CM | POA: Diagnosis not present

## 2016-11-09 ENCOUNTER — Other Ambulatory Visit (HOSPITAL_BASED_OUTPATIENT_CLINIC_OR_DEPARTMENT_OTHER): Payer: BLUE CROSS/BLUE SHIELD

## 2016-11-09 ENCOUNTER — Ambulatory Visit (HOSPITAL_BASED_OUTPATIENT_CLINIC_OR_DEPARTMENT_OTHER): Payer: BLUE CROSS/BLUE SHIELD | Admitting: Oncology

## 2016-11-09 VITALS — BP 162/81 | HR 82 | Temp 97.7°F | Resp 18 | Wt 215.9 lb

## 2016-11-09 DIAGNOSIS — D708 Other neutropenia: Secondary | ICD-10-CM

## 2016-11-09 DIAGNOSIS — D72819 Decreased white blood cell count, unspecified: Secondary | ICD-10-CM

## 2016-11-09 DIAGNOSIS — D709 Neutropenia, unspecified: Secondary | ICD-10-CM

## 2016-11-09 LAB — CBC WITH DIFFERENTIAL/PLATELET
BASO%: 0.6 % (ref 0.0–2.0)
Basophils Absolute: 0 10*3/uL (ref 0.0–0.1)
EOS ABS: 0.2 10*3/uL (ref 0.0–0.5)
EOS%: 9.7 % — ABNORMAL HIGH (ref 0.0–7.0)
HCT: 40.9 % (ref 38.4–49.9)
HGB: 13.8 g/dL (ref 13.0–17.1)
LYMPH%: 52.9 % — ABNORMAL HIGH (ref 14.0–49.0)
MCH: 32.3 pg (ref 27.2–33.4)
MCHC: 33.7 g/dL (ref 32.0–36.0)
MCV: 95.8 fL (ref 79.3–98.0)
MONO#: 0.3 10*3/uL (ref 0.1–0.9)
MONO%: 21.9 % — AB (ref 0.0–14.0)
NEUT%: 14.9 % — ABNORMAL LOW (ref 39.0–75.0)
NEUTROS ABS: 0.2 10*3/uL — AB (ref 1.5–6.5)
NRBC: 0 % (ref 0–0)
PLATELETS: 198 10*3/uL (ref 140–400)
RBC: 4.27 10*6/uL (ref 4.20–5.82)
RDW: 13.2 % (ref 11.0–14.6)
WBC: 1.6 10*3/uL — ABNORMAL LOW (ref 4.0–10.3)
lymph#: 0.8 10*3/uL — ABNORMAL LOW (ref 0.9–3.3)

## 2016-11-09 NOTE — Progress Notes (Signed)
Hematology and Oncology Follow Up Visit  Brent Massey [id number] 03/04/1956 61 y.o. 11/09/2016 10:18 AM Brent Massey, MDHusain, Denton Ar, MD   Principle Diagnosis: 61 year old gentleman with chronic neutropenia diagnosed in 2012. His workup has been unrevealing for any hematological disorder. Etiology is related to congenital reasons.   Prior Therapy: He is status post bone marrow biopsy in 2012 which showed no major abnormalities.  Current therapy: Observation and surveillance.  Interim History: [name] presents today for a follow-up visit. Since the last visit, he continues to do very well without any recent complaints. He is recovering from an upper respiratory infection including sinus congestion and sore throat. He denied any fevers, chills or sweats. He denied any productive cough or illness. He continues to work full time without any decline in his ability to do so. His appetite and performance status is unchanged.  He does not report any headaches, blurry vision, syncope or seizures. He does not report any chest pain, palpitation, orthopnea or leg edema. He does not report any cough, wheezing or hemoptysis. He does not report any nausea, vomiting or abdominal pain. He does not report any change in his bowel habits, constipation or diarrhea. He does not report any frequency urgency or hesitancy. Remaining review of systems unremarkable.   Medications: I have reviewed the patient's current medications.  Current Outpatient Prescriptions  Medication Sig Dispense Refill  . escitalopram (LEXAPRO) 20 MG tablet Take 20 mg by mouth daily.    . furosemide (LASIX) 40 MG tablet Take 40 mg by mouth daily.    . Ibuprofen-Famotidine (DUEXIS) 800-26.6 MG TABS Take 1 tablet by mouth every 8 (eight) hours as needed (for pain and back swelling).    Marland Kitchen loratadine (CLARITIN) 10 MG tablet Take 10 mg by mouth daily as needed for allergies. For allergies.    . mometasone (NASONEX) 50 MCG/ACT nasal spray  Place 2 sprays into the nose daily as needed (allergies).     . Multiple Vitamin (MULTIVITAMIN WITH MINERALS) TABS Take 1 tablet by mouth daily.    . potassium chloride (K-DUR,KLOR-CON) 10 MEQ tablet Take 10 mEq by mouth 2 (two) times daily.    Marland Kitchen PRESCRIPTION MEDICATION Apply 1 application topically 4 (four) times daily as needed (swelling). C anti-inflammatory cream; apply to bottom of feet/heels for plantar fasciitis    . Probiotic Product (PROBIOTIC FORMULA) CAPS Take 1 capsule by mouth daily.     . saw palmetto 160 MG capsule Take 160 mg by mouth daily.     No current facility-administered medications for this visit.      Allergies:  Allergies  Allergen Reactions  . Lyrica [Pregabalin] Swelling    Legs/ankle/feet swelling  . Neurontin [Gabapentin] Swelling    Legs/ankle/feet swelling    Past Medical History, Surgical history, Social history, and Family History were reviewed and updated.   Physical Exam: Blood pressure (!) 162/81, pulse 82, temperature 97.7 F (36.5 C), temperature source Oral, resp. rate 18, weight 215 lb 14.4 oz (97.9 kg), SpO2 100 %. ECOG: 0 General appearance: alert and cooperative appeared without distress. Head: Normocephalic, without obvious abnormality Neck: no adenopathy Lymph nodes: Cervical, supraclavicular, and axillary nodes normal. Heart:regular rate and rhythm, S1, S2 normal, no murmur, click, rub or gallop Lung:chest clear, no wheezing, rales, normal symmetric air entry.  Abdomin: soft, non-tender, without masses or organomegaly no shifting dullness or ascites. EXT:no erythema, induration, or nodules   Lab Results: Lab Results  Component Value Date   WBC 1.6 (L) 11/09/2016   HGB  13.8 11/09/2016   HCT 40.9 11/09/2016   MCV 95.8 11/09/2016   PLT 198 11/09/2016     Chemistry      Component Value Date/Time   NA 141 12/08/2013 1038   NA 140 07/19/2013 1347   K 3.3 (L) 12/08/2013 1038   K 4.2 07/19/2013 1347   CL 101 12/08/2013 1038    CL 103 07/20/2012 1155   CO2 28 12/08/2013 1038   CO2 28 07/19/2013 1347   BUN 21 12/08/2013 1038   BUN 18.0 07/19/2013 1347   CREATININE 1.25 12/08/2013 1038   CREATININE 1.2 07/19/2013 1347      Component Value Date/Time   CALCIUM 8.7 12/08/2013 1038   CALCIUM 9.0 07/19/2013 1347   ALKPHOS 69 12/08/2013 1038   ALKPHOS 71 07/19/2013 1347   AST 33 12/08/2013 1038   AST 24 07/19/2013 1347   ALT 13 12/08/2013 1038   ALT 14 07/19/2013 1347   BILITOT 0.4 12/08/2013 1038   BILITOT 0.62 07/19/2013 1347         Impression and Plan:  61 year old gentleman with the following issues:  1. Neutropenia chronic in nature dates of back to 2012. His workup at that point including a bone marrow biopsy which showed no evidence of a lymphoproliferative disorder.  The etiology of his neutropenia is probably congenital in nature and related to genetic component. His brother has similar issues and no recurrent malignancy had been discovered.  His CBC was personally reviewed today and is no different than his counts dating back to 2012. I recommended no intervention at this time and no need for any growth factor support at this time.  I see no further need for any hematological workup at this time but I recommended periodic monitoring on an annual basis. I'm happy to reevaluate him if he develops other cytopenias.  2. Infectious prophylaxis: I have recommended exercising commonsense including handwashing and avoiding sick contacts in general. He has not had any recurrent infections the majority of his life.  3. Follow-up: I'm happy to see him in the future as needed.  HQIONG,EXBMW, MD 3/21/201810:18 AM

## 2017-02-14 DIAGNOSIS — H40013 Open angle with borderline findings, low risk, bilateral: Secondary | ICD-10-CM | POA: Diagnosis not present

## 2017-02-14 DIAGNOSIS — Z83511 Family history of glaucoma: Secondary | ICD-10-CM | POA: Diagnosis not present

## 2017-02-14 DIAGNOSIS — R94112 Abnormal visually evoked potential [VEP]: Secondary | ICD-10-CM | POA: Diagnosis not present

## 2017-04-12 DIAGNOSIS — F324 Major depressive disorder, single episode, in partial remission: Secondary | ICD-10-CM | POA: Diagnosis not present

## 2017-04-12 DIAGNOSIS — Z125 Encounter for screening for malignant neoplasm of prostate: Secondary | ICD-10-CM | POA: Diagnosis not present

## 2017-04-12 DIAGNOSIS — F411 Generalized anxiety disorder: Secondary | ICD-10-CM | POA: Diagnosis not present

## 2017-04-12 DIAGNOSIS — Z0000 Encounter for general adult medical examination without abnormal findings: Secondary | ICD-10-CM | POA: Diagnosis not present

## 2017-04-12 DIAGNOSIS — I872 Venous insufficiency (chronic) (peripheral): Secondary | ICD-10-CM | POA: Diagnosis not present

## 2017-04-12 DIAGNOSIS — I10 Essential (primary) hypertension: Secondary | ICD-10-CM | POA: Diagnosis not present

## 2017-04-27 DIAGNOSIS — J309 Allergic rhinitis, unspecified: Secondary | ICD-10-CM | POA: Diagnosis not present

## 2017-04-27 DIAGNOSIS — R430 Anosmia: Secondary | ICD-10-CM | POA: Diagnosis not present

## 2017-04-28 ENCOUNTER — Other Ambulatory Visit: Payer: Self-pay | Admitting: Otolaryngology

## 2017-04-28 DIAGNOSIS — R430 Anosmia: Secondary | ICD-10-CM

## 2017-05-07 ENCOUNTER — Other Ambulatory Visit: Payer: BLUE CROSS/BLUE SHIELD

## 2017-05-29 ENCOUNTER — Other Ambulatory Visit: Payer: BLUE CROSS/BLUE SHIELD

## 2017-07-25 DIAGNOSIS — H33321 Round hole, right eye: Secondary | ICD-10-CM | POA: Diagnosis not present

## 2017-07-25 DIAGNOSIS — H40013 Open angle with borderline findings, low risk, bilateral: Secondary | ICD-10-CM | POA: Diagnosis not present

## 2017-10-13 DIAGNOSIS — I872 Venous insufficiency (chronic) (peripheral): Secondary | ICD-10-CM | POA: Diagnosis not present

## 2017-10-13 DIAGNOSIS — I10 Essential (primary) hypertension: Secondary | ICD-10-CM | POA: Diagnosis not present

## 2017-10-13 DIAGNOSIS — F324 Major depressive disorder, single episode, in partial remission: Secondary | ICD-10-CM | POA: Diagnosis not present

## 2017-11-06 DIAGNOSIS — K649 Unspecified hemorrhoids: Secondary | ICD-10-CM | POA: Diagnosis not present

## 2018-03-10 DIAGNOSIS — R05 Cough: Secondary | ICD-10-CM | POA: Diagnosis not present

## 2018-03-10 DIAGNOSIS — Z6827 Body mass index (BMI) 27.0-27.9, adult: Secondary | ICD-10-CM | POA: Diagnosis not present

## 2018-03-10 DIAGNOSIS — J00 Acute nasopharyngitis [common cold]: Secondary | ICD-10-CM | POA: Diagnosis not present

## 2018-03-10 DIAGNOSIS — R0981 Nasal congestion: Secondary | ICD-10-CM | POA: Diagnosis not present

## 2018-04-16 DIAGNOSIS — I872 Venous insufficiency (chronic) (peripheral): Secondary | ICD-10-CM | POA: Diagnosis not present

## 2018-04-16 DIAGNOSIS — Z0000 Encounter for general adult medical examination without abnormal findings: Secondary | ICD-10-CM | POA: Diagnosis not present

## 2018-04-16 DIAGNOSIS — I10 Essential (primary) hypertension: Secondary | ICD-10-CM | POA: Diagnosis not present

## 2018-07-26 DIAGNOSIS — H40023 Open angle with borderline findings, high risk, bilateral: Secondary | ICD-10-CM | POA: Diagnosis not present

## 2018-10-16 DIAGNOSIS — F419 Anxiety disorder, unspecified: Secondary | ICD-10-CM | POA: Diagnosis not present

## 2018-10-16 DIAGNOSIS — D72819 Decreased white blood cell count, unspecified: Secondary | ICD-10-CM | POA: Diagnosis not present

## 2018-10-16 DIAGNOSIS — I10 Essential (primary) hypertension: Secondary | ICD-10-CM | POA: Diagnosis not present

## 2018-10-16 DIAGNOSIS — J309 Allergic rhinitis, unspecified: Secondary | ICD-10-CM | POA: Diagnosis not present

## 2018-11-20 DIAGNOSIS — M25562 Pain in left knee: Secondary | ICD-10-CM | POA: Diagnosis not present

## 2018-11-20 DIAGNOSIS — M25561 Pain in right knee: Secondary | ICD-10-CM | POA: Diagnosis not present

## 2018-11-20 DIAGNOSIS — M170 Bilateral primary osteoarthritis of knee: Secondary | ICD-10-CM | POA: Diagnosis not present

## 2020-03-26 DIAGNOSIS — I10 Essential (primary) hypertension: Secondary | ICD-10-CM | POA: Diagnosis not present

## 2020-03-26 DIAGNOSIS — D72819 Decreased white blood cell count, unspecified: Secondary | ICD-10-CM | POA: Diagnosis not present

## 2020-03-26 DIAGNOSIS — N400 Enlarged prostate without lower urinary tract symptoms: Secondary | ICD-10-CM | POA: Diagnosis not present

## 2020-03-26 DIAGNOSIS — Z79899 Other long term (current) drug therapy: Secondary | ICD-10-CM | POA: Diagnosis not present

## 2020-03-26 DIAGNOSIS — Z0000 Encounter for general adult medical examination without abnormal findings: Secondary | ICD-10-CM | POA: Diagnosis not present

## 2020-03-26 DIAGNOSIS — Z13220 Encounter for screening for lipoid disorders: Secondary | ICD-10-CM | POA: Diagnosis not present

## 2020-06-11 DIAGNOSIS — M25532 Pain in left wrist: Secondary | ICD-10-CM | POA: Diagnosis not present

## 2020-06-11 DIAGNOSIS — M25562 Pain in left knee: Secondary | ICD-10-CM | POA: Diagnosis not present

## 2020-06-11 DIAGNOSIS — M25561 Pain in right knee: Secondary | ICD-10-CM | POA: Diagnosis not present

## 2020-06-11 DIAGNOSIS — M25531 Pain in right wrist: Secondary | ICD-10-CM | POA: Diagnosis not present

## 2020-06-18 DIAGNOSIS — M25532 Pain in left wrist: Secondary | ICD-10-CM | POA: Diagnosis not present

## 2020-06-18 DIAGNOSIS — M25531 Pain in right wrist: Secondary | ICD-10-CM | POA: Diagnosis not present

## 2020-07-24 DIAGNOSIS — R768 Other specified abnormal immunological findings in serum: Secondary | ICD-10-CM | POA: Diagnosis not present

## 2020-07-24 DIAGNOSIS — M2550 Pain in unspecified joint: Secondary | ICD-10-CM | POA: Diagnosis not present

## 2020-07-24 DIAGNOSIS — R5382 Chronic fatigue, unspecified: Secondary | ICD-10-CM | POA: Diagnosis not present

## 2020-08-22 HISTORY — PX: KNEE ARTHROSCOPY W/ MENISCECTOMY: SHX1879

## 2020-09-29 DIAGNOSIS — D72819 Decreased white blood cell count, unspecified: Secondary | ICD-10-CM | POA: Diagnosis not present

## 2020-09-29 DIAGNOSIS — F322 Major depressive disorder, single episode, severe without psychotic features: Secondary | ICD-10-CM | POA: Diagnosis not present

## 2020-09-29 DIAGNOSIS — I10 Essential (primary) hypertension: Secondary | ICD-10-CM | POA: Diagnosis not present

## 2020-09-29 DIAGNOSIS — I872 Venous insufficiency (chronic) (peripheral): Secondary | ICD-10-CM | POA: Diagnosis not present

## 2020-10-14 DIAGNOSIS — M25561 Pain in right knee: Secondary | ICD-10-CM | POA: Diagnosis not present

## 2020-10-14 DIAGNOSIS — M25461 Effusion, right knee: Secondary | ICD-10-CM | POA: Diagnosis not present

## 2020-10-14 DIAGNOSIS — R7989 Other specified abnormal findings of blood chemistry: Secondary | ICD-10-CM | POA: Diagnosis not present

## 2020-10-14 DIAGNOSIS — M25562 Pain in left knee: Secondary | ICD-10-CM | POA: Diagnosis not present

## 2020-12-22 DIAGNOSIS — H8110 Benign paroxysmal vertigo, unspecified ear: Secondary | ICD-10-CM | POA: Diagnosis not present

## 2020-12-22 DIAGNOSIS — D72819 Decreased white blood cell count, unspecified: Secondary | ICD-10-CM | POA: Diagnosis not present

## 2020-12-22 DIAGNOSIS — F330 Major depressive disorder, recurrent, mild: Secondary | ICD-10-CM | POA: Diagnosis not present

## 2020-12-22 DIAGNOSIS — I10 Essential (primary) hypertension: Secondary | ICD-10-CM | POA: Diagnosis not present

## 2021-01-05 DIAGNOSIS — I10 Essential (primary) hypertension: Secondary | ICD-10-CM | POA: Diagnosis not present

## 2021-03-11 DIAGNOSIS — H40023 Open angle with borderline findings, high risk, bilateral: Secondary | ICD-10-CM | POA: Diagnosis not present

## 2021-03-11 DIAGNOSIS — H2513 Age-related nuclear cataract, bilateral: Secondary | ICD-10-CM | POA: Diagnosis not present

## 2021-03-11 DIAGNOSIS — H524 Presbyopia: Secondary | ICD-10-CM | POA: Diagnosis not present

## 2021-03-11 DIAGNOSIS — H5213 Myopia, bilateral: Secondary | ICD-10-CM | POA: Diagnosis not present

## 2021-03-11 DIAGNOSIS — H52223 Regular astigmatism, bilateral: Secondary | ICD-10-CM | POA: Diagnosis not present

## 2021-03-16 DIAGNOSIS — M170 Bilateral primary osteoarthritis of knee: Secondary | ICD-10-CM | POA: Diagnosis not present

## 2021-03-16 DIAGNOSIS — M1712 Unilateral primary osteoarthritis, left knee: Secondary | ICD-10-CM | POA: Diagnosis not present

## 2021-03-25 DIAGNOSIS — Z125 Encounter for screening for malignant neoplasm of prostate: Secondary | ICD-10-CM | POA: Diagnosis not present

## 2021-03-25 DIAGNOSIS — F419 Anxiety disorder, unspecified: Secondary | ICD-10-CM | POA: Diagnosis not present

## 2021-03-25 DIAGNOSIS — D72819 Decreased white blood cell count, unspecified: Secondary | ICD-10-CM | POA: Diagnosis not present

## 2021-03-25 DIAGNOSIS — Z0000 Encounter for general adult medical examination without abnormal findings: Secondary | ICD-10-CM | POA: Diagnosis not present

## 2021-03-25 DIAGNOSIS — Z13220 Encounter for screening for lipoid disorders: Secondary | ICD-10-CM | POA: Diagnosis not present

## 2021-03-25 DIAGNOSIS — I10 Essential (primary) hypertension: Secondary | ICD-10-CM | POA: Diagnosis not present

## 2021-03-25 DIAGNOSIS — N400 Enlarged prostate without lower urinary tract symptoms: Secondary | ICD-10-CM | POA: Diagnosis not present

## 2021-04-03 DIAGNOSIS — M25561 Pain in right knee: Secondary | ICD-10-CM | POA: Diagnosis not present

## 2021-04-08 DIAGNOSIS — S83241D Other tear of medial meniscus, current injury, right knee, subsequent encounter: Secondary | ICD-10-CM | POA: Diagnosis not present

## 2021-05-22 HISTORY — PX: EYE SURGERY: SHX253

## 2021-05-24 DIAGNOSIS — H40023 Open angle with borderline findings, high risk, bilateral: Secondary | ICD-10-CM | POA: Diagnosis not present

## 2021-05-24 DIAGNOSIS — H33321 Round hole, right eye: Secondary | ICD-10-CM | POA: Diagnosis not present

## 2021-05-25 DIAGNOSIS — H43813 Vitreous degeneration, bilateral: Secondary | ICD-10-CM | POA: Diagnosis not present

## 2021-05-25 DIAGNOSIS — H33321 Round hole, right eye: Secondary | ICD-10-CM | POA: Diagnosis not present

## 2021-05-25 DIAGNOSIS — H3589 Other specified retinal disorders: Secondary | ICD-10-CM | POA: Diagnosis not present

## 2021-05-25 DIAGNOSIS — H35461 Secondary vitreoretinal degeneration, right eye: Secondary | ICD-10-CM | POA: Diagnosis not present

## 2021-06-08 DIAGNOSIS — H35461 Secondary vitreoretinal degeneration, right eye: Secondary | ICD-10-CM | POA: Diagnosis not present

## 2021-06-08 DIAGNOSIS — H33321 Round hole, right eye: Secondary | ICD-10-CM | POA: Diagnosis not present

## 2021-06-08 DIAGNOSIS — H33311 Horseshoe tear of retina without detachment, right eye: Secondary | ICD-10-CM | POA: Diagnosis not present

## 2021-06-08 DIAGNOSIS — H3589 Other specified retinal disorders: Secondary | ICD-10-CM | POA: Diagnosis not present

## 2021-06-22 DIAGNOSIS — H35461 Secondary vitreoretinal degeneration, right eye: Secondary | ICD-10-CM | POA: Diagnosis not present

## 2021-06-22 DIAGNOSIS — H3589 Other specified retinal disorders: Secondary | ICD-10-CM | POA: Diagnosis not present

## 2021-06-22 DIAGNOSIS — H31091 Other chorioretinal scars, right eye: Secondary | ICD-10-CM | POA: Diagnosis not present

## 2021-06-22 DIAGNOSIS — H43811 Vitreous degeneration, right eye: Secondary | ICD-10-CM | POA: Diagnosis not present

## 2021-08-03 DIAGNOSIS — H3589 Other specified retinal disorders: Secondary | ICD-10-CM | POA: Diagnosis not present

## 2021-08-03 DIAGNOSIS — H31091 Other chorioretinal scars, right eye: Secondary | ICD-10-CM | POA: Diagnosis not present

## 2021-08-03 DIAGNOSIS — H35461 Secondary vitreoretinal degeneration, right eye: Secondary | ICD-10-CM | POA: Diagnosis not present

## 2021-08-03 DIAGNOSIS — H43811 Vitreous degeneration, right eye: Secondary | ICD-10-CM | POA: Diagnosis not present

## 2021-08-04 DIAGNOSIS — Y999 Unspecified external cause status: Secondary | ICD-10-CM | POA: Diagnosis not present

## 2021-08-04 DIAGNOSIS — X58XXXA Exposure to other specified factors, initial encounter: Secondary | ICD-10-CM | POA: Diagnosis not present

## 2021-08-04 DIAGNOSIS — S83231A Complex tear of medial meniscus, current injury, right knee, initial encounter: Secondary | ICD-10-CM | POA: Diagnosis not present

## 2021-08-04 DIAGNOSIS — S83261A Peripheral tear of lateral meniscus, current injury, right knee, initial encounter: Secondary | ICD-10-CM | POA: Diagnosis not present

## 2021-08-04 DIAGNOSIS — G8918 Other acute postprocedural pain: Secondary | ICD-10-CM | POA: Diagnosis not present

## 2021-08-04 DIAGNOSIS — M659 Synovitis and tenosynovitis, unspecified: Secondary | ICD-10-CM | POA: Diagnosis not present

## 2021-08-04 DIAGNOSIS — M948X6 Other specified disorders of cartilage, lower leg: Secondary | ICD-10-CM | POA: Diagnosis not present

## 2021-08-04 DIAGNOSIS — M94261 Chondromalacia, right knee: Secondary | ICD-10-CM | POA: Diagnosis not present

## 2021-09-23 DIAGNOSIS — Z4789 Encounter for other orthopedic aftercare: Secondary | ICD-10-CM | POA: Diagnosis not present

## 2021-09-23 DIAGNOSIS — M25562 Pain in left knee: Secondary | ICD-10-CM | POA: Diagnosis not present

## 2021-09-28 DIAGNOSIS — I10 Essential (primary) hypertension: Secondary | ICD-10-CM | POA: Diagnosis not present

## 2021-09-28 DIAGNOSIS — D708 Other neutropenia: Secondary | ICD-10-CM | POA: Diagnosis not present

## 2021-09-28 DIAGNOSIS — I872 Venous insufficiency (chronic) (peripheral): Secondary | ICD-10-CM | POA: Diagnosis not present

## 2021-09-28 DIAGNOSIS — N400 Enlarged prostate without lower urinary tract symptoms: Secondary | ICD-10-CM | POA: Diagnosis not present

## 2021-11-15 DIAGNOSIS — H3589 Other specified retinal disorders: Secondary | ICD-10-CM | POA: Diagnosis not present

## 2021-11-15 DIAGNOSIS — H31091 Other chorioretinal scars, right eye: Secondary | ICD-10-CM | POA: Diagnosis not present

## 2021-11-15 DIAGNOSIS — H35461 Secondary vitreoretinal degeneration, right eye: Secondary | ICD-10-CM | POA: Diagnosis not present

## 2021-11-15 DIAGNOSIS — H43813 Vitreous degeneration, bilateral: Secondary | ICD-10-CM | POA: Diagnosis not present

## 2021-12-21 DIAGNOSIS — Z4789 Encounter for other orthopedic aftercare: Secondary | ICD-10-CM | POA: Diagnosis not present

## 2022-01-03 DIAGNOSIS — M25562 Pain in left knee: Secondary | ICD-10-CM | POA: Diagnosis not present

## 2022-01-12 DIAGNOSIS — M25562 Pain in left knee: Secondary | ICD-10-CM | POA: Diagnosis not present

## 2022-01-18 DIAGNOSIS — M25561 Pain in right knee: Secondary | ICD-10-CM | POA: Diagnosis not present

## 2022-01-26 DIAGNOSIS — M25562 Pain in left knee: Secondary | ICD-10-CM | POA: Diagnosis not present

## 2022-02-01 DIAGNOSIS — M25561 Pain in right knee: Secondary | ICD-10-CM | POA: Diagnosis not present

## 2022-02-15 DIAGNOSIS — M25562 Pain in left knee: Secondary | ICD-10-CM | POA: Diagnosis not present

## 2022-02-18 DIAGNOSIS — M25561 Pain in right knee: Secondary | ICD-10-CM | POA: Diagnosis not present

## 2022-02-23 DIAGNOSIS — M25561 Pain in right knee: Secondary | ICD-10-CM | POA: Diagnosis not present

## 2022-03-01 DIAGNOSIS — M25561 Pain in right knee: Secondary | ICD-10-CM | POA: Diagnosis not present

## 2022-03-04 DIAGNOSIS — M25561 Pain in right knee: Secondary | ICD-10-CM | POA: Diagnosis not present

## 2022-03-28 DIAGNOSIS — I10 Essential (primary) hypertension: Secondary | ICD-10-CM | POA: Diagnosis not present

## 2022-03-28 DIAGNOSIS — R42 Dizziness and giddiness: Secondary | ICD-10-CM | POA: Diagnosis not present

## 2022-03-28 DIAGNOSIS — F330 Major depressive disorder, recurrent, mild: Secondary | ICD-10-CM | POA: Diagnosis not present

## 2022-03-28 DIAGNOSIS — Z8249 Family history of ischemic heart disease and other diseases of the circulatory system: Secondary | ICD-10-CM | POA: Diagnosis not present

## 2022-03-28 DIAGNOSIS — Z0000 Encounter for general adult medical examination without abnormal findings: Secondary | ICD-10-CM | POA: Diagnosis not present

## 2022-03-28 DIAGNOSIS — Z125 Encounter for screening for malignant neoplasm of prostate: Secondary | ICD-10-CM | POA: Diagnosis not present

## 2022-03-28 DIAGNOSIS — Z23 Encounter for immunization: Secondary | ICD-10-CM | POA: Diagnosis not present

## 2022-03-28 DIAGNOSIS — D72819 Decreased white blood cell count, unspecified: Secondary | ICD-10-CM | POA: Diagnosis not present

## 2022-04-07 ENCOUNTER — Ambulatory Visit: Payer: BC Managed Care – PPO | Admitting: Internal Medicine

## 2022-04-07 ENCOUNTER — Encounter: Payer: Self-pay | Admitting: Internal Medicine

## 2022-04-07 VITALS — Temp 98.0°F | Resp 16 | Ht 75.0 in | Wt 214.0 lb

## 2022-04-07 DIAGNOSIS — I495 Sick sinus syndrome: Secondary | ICD-10-CM

## 2022-04-07 DIAGNOSIS — R42 Dizziness and giddiness: Secondary | ICD-10-CM

## 2022-04-07 DIAGNOSIS — R55 Syncope and collapse: Secondary | ICD-10-CM

## 2022-04-07 MED ORDER — ROSUVASTATIN CALCIUM 20 MG PO TABS: 20.0000 mg | ORAL_TABLET | Freq: Every day | ORAL | 2 refills | Status: DC

## 2022-04-07 NOTE — Progress Notes (Signed)
Primary Physician/Referring:  Wenda Low, MD  Patient ID: Brent Massey, male    DOB: 14-Dec-1955, 66 y.o.   MRN: [id number]  Chief Complaint  Patient presents with   Dizziness   New Patient (Initial Visit)   HPI:    Brent Massey  is a 66 y.o. male with past medical history significant for bradycardia, syncope, and bilateral lower extremity edema who is here to establish care with cardiology.  He states that recently he has been feeling very lightheaded, he feels like he is going to pass out, and he did have 2 episodes where he did pass out.  He has also been having chest pain and left arm pain.  He states it comes and goes but he has been noticing that it is more severe lately.  When he used to play basketball he used to get fluttering in his chest and pressure which made him stop playing basketball.  Both of his parents have significant cardiac disease.  Patient has never smoked and he does not drink alcohol.   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   BPH (benign prostatic hyperplasia)    Depression    GERD (gastroesophageal reflux disease)    Headache    migraines as teenager   Hypertension    Leukocytopenia    saw oncology first ,now followed by PCP- Dr. Lysle Rubens.   Neutropenia, unspecified ([place])    Plantar fascia syndrome    bilateral- intermittent   Past Surgical History:  Procedure Laterality Date   COLONOSCOPY WITH PROPOFOL N/A 07/28/2015   Procedure: COLONOSCOPY WITH PROPOFOL;  Surgeon: Garlan Fair, MD;  Location: [facility];  Service: Endoscopy;  Laterality: N/A;   EYE SURGERY     laser surgery for retina tear   FRACTURE SURGERY     HERNIA REPAIR     INGUINAL EXPLORATION Right    "soft tissue mass removal" 05-26-15 [facility] [place]   KNEE SURGERY     WRIST FRACTURE SURGERY     Family History  Problem  Relation Age of Onset   Heart attack Mother    Heart attack Father 45   Depression Sister    Prostate cancer Brother    Depression Brother     Social History   Tobacco Use   Smoking status: Never   Smokeless tobacco: Not on file  Substance Use Topics   Alcohol use: No   Marital Status: Married  ROS  Review of Systems  Cardiovascular:  Positive for chest pain, irregular heartbeat, near-syncope, palpitations and syncope.  Neurological:  Positive for light-headedness.  All other systems reviewed and are negative. Objective  Temperature 98 F (36.7 C), resp. rate 16, height '6\' 3"'$  (1.905 m), weight 214 lb (97.1 kg), SpO2 96 %. Body mass index is 26.75 kg/m.     04/07/2022   10:38 AM 11/09/2016   10:08 AM 05/11/2016   10:57 AM  Vitals with BMI  Height '6\' 3"'$   '6\' 3"'$   Weight 214 lbs 215 lbs 14 oz 212 lbs 14 oz  BMI 09.73  53.2  Systolic  992 426  Diastolic  81 72  Pulse  82 70     Physical Exam Vitals reviewed.  Constitutional:      Appearance: Normal appearance. He is normal weight.  HENT:     Head: Normocephalic  and atraumatic.  Neck:     Vascular: No carotid bruit.  Cardiovascular:     Rate and Rhythm: Regular rhythm. Bradycardia present.     Pulses: Normal pulses.     Heart sounds: Normal heart sounds. No murmur heard. Pulmonary:     Effort: Pulmonary effort is normal.     Breath sounds: Normal breath sounds.  Abdominal:     General: Abdomen is flat. Bowel sounds are normal.     Palpations: Abdomen is soft.  Musculoskeletal:     Right lower leg: Edema present.     Left lower leg: Edema present.  Skin:    General: Skin is warm and dry.  Neurological:     Mental Status: He is alert.   Medications and allergies   Allergies  Allergen Reactions   Lyrica [Pregabalin] Swelling    Legs/ankle/feet swelling   Neurontin [Gabapentin] Swelling    Legs/ankle/feet swelling     Medication list after today's encounter   Current Outpatient Medications:     escitalopram (LEXAPRO) 20 MG tablet, Take 20 mg by mouth daily., Disp: , Rfl:    furosemide (LASIX) 40 MG tablet, Take 40 mg by mouth daily., Disp: , Rfl:    loratadine (CLARITIN) 10 MG tablet, Take 10 mg by mouth daily as needed for allergies. For allergies., Disp: , Rfl:    Multiple Vitamin (MULTIVITAMIN WITH MINERALS) TABS, Take 1 tablet by mouth daily., Disp: , Rfl:    potassium chloride (K-DUR,KLOR-CON) 10 MEQ tablet, Take 10 mEq by mouth 2 (two) times daily., Disp: , Rfl:    rosuvastatin (CRESTOR) 20 MG tablet, Take 1 tablet (20 mg total) by mouth daily., Disp: 30 tablet, Rfl: 2   saw palmetto 160 MG capsule, Take 160 mg by mouth daily., Disp: , Rfl:    Cholecalciferol (VITAMIN D3) 250 MCG (10000 UT) capsule, See admin instructions., Disp: , Rfl:    mometasone (NASONEX) 50 MCG/ACT nasal spray, Place 2 sprays into the nose daily as needed (allergies). , Disp: , Rfl:   Laboratory examination:   Lab Results  Component Value Date   NA 141 12/08/2013   K 3.3 (L) 12/08/2013   CO2 28 12/08/2013   GLUCOSE 96 12/08/2013   BUN 21 12/08/2013   CREATININE 1.25 12/08/2013   CALCIUM 8.7 12/08/2013   GFRNONAA 62 (L) 12/08/2013       Latest Ref Rng & Units 12/08/2013   10:38 AM 07/19/2013    1:47 PM 07/20/2012   11:55 AM  CMP  Glucose 70 - 99 mg/dL 96  149  89   BUN 6 - 23 mg/dL 21  18.0  18.0   Creatinine 0.50 - 1.35 mg/dL 1.25  1.2  1.1   Sodium 137 - 147 mEq/L 141  140  140   Potassium 3.7 - 5.3 mEq/L 3.3  4.2  4.2   Chloride 96 - 112 mEq/L 101   103   CO2 19 - 32 mEq/L '28  28  30   '$ Calcium 8.4 - 10.5 mg/dL 8.7  9.0  9.3   Total Protein 6.0 - 8.3 g/dL 6.8  7.1  6.7   Total Bilirubin 0.3 - 1.2 mg/dL 0.4  0.62  0.71   Alkaline Phos 39 - 117 U/L 69  71  64   AST 0 - 37 U/L 33  24  24   ALT 0 - 53 U/L '13  14  19       '$ Latest Ref Rng & Units 11/09/2016  9:55 AM 12/08/2013   10:38 AM 12/07/2013   12:01 PM  CBC  WBC 4.0 - 10.3 10e3/uL 1.6  2.6  1.7   Hemoglobin 13.0 - 17.1 g/dL  13.8  13.3  14.2   Hematocrit 38.4 - 49.9 % 40.9  40.8  43.9   Platelets 140 - 400 10e3/uL 198  115      Lipid Panel No results for input(s): "CHOL", "TRIG", "[place]", "VLDL", "HDL", "CHOLHDL", "LDLDIRECT" in the last 8760 hours.  HEMOGLOBIN A1C No results found for: "HGBA1C", "MPG" TSH No results for input(s): "TSH" in the last 8760 hours.  External labs:     Radiology:    Cardiac Studies:   No results found for this or any previous visit from the past 1095 days.     No results found for this or any previous visit from the past 1095 days.     EKG:   04/07/22 EKG: sinus bradycardia, 58bpm    Assessment     ICD-10-CM   1. Dizziness  R42 EKG 12-Lead    [facility] ECHOCARDIOGRAM COMPLETE    [facility] MYOCARDIAL PERFUSION WO LEXISCAN    LONG TERM MONITOR (3-14 DAYS)    2. Syncope and collapse  R55 PCV ECHOCARDIOGRAM COMPLETE    PCV MYOCARDIAL PERFUSION WO LEXISCAN    LONG TERM MONITOR (3-14 DAYS)    3. Tachycardia-bradycardia syndrome ([place])  I49.5 PCV ECHOCARDIOGRAM COMPLETE    PCV MYOCARDIAL PERFUSION WO LEXISCAN    LONG TERM MONITOR (3-14 DAYS)       Orders Placed This Encounter  Procedures   PCV MYOCARDIAL PERFUSION WO LEXISCAN    Standing Status:   Future    Standing Expiration Date:   06/07/2022   LONG TERM MONITOR (3-14 DAYS)    Standing Status:   Future    Standing Expiration Date:   04/08/2023    Order Specific Question:   Where should this test be performed?    Answer:   [facility]    Order Specific Question:   Does the patient have an implanted cardiac device?    Answer:   No    Order Specific Question:   Prescribed days of wear    Answer:   65    Order Specific Question:   Type of enrollment    Answer:   Clinic Enrollment    Order Specific Question:   Release to patient    Answer:   Immediate   EKG 12-Lead   PCV ECHOCARDIOGRAM COMPLETE    Standing Status:   Future    Standing Expiration Date:   04/08/2023    Meds ordered this encounter   Medications   rosuvastatin (CRESTOR) 20 MG tablet    Sig: Take 1 tablet (20 mg total) by mouth daily.    Dispense:  30 tablet    Refill:  2    Medications Discontinued During This Encounter  Medication Reason   PRESCRIPTION MEDICATION    Probiotic Product (PROBIOTIC FORMULA) CAPS    Ibuprofen-Famotidine (DUEXIS) 800-26.6 MG TABS      Recommendations:   Brent Massey is a 67 y.o. male with chest pain, syncope, and bilateral lower extremity edema  Continue taking baby aspirin daily Will start Crestor 20 mg for aggressive risk factor modification Event monitor ordered due to bradycardia and syncope Will obtain echo and stress test given anginal symptoms Continue Lasix and compression stockings Follow-up in 6 weeks or sooner if needed    Floydene Flock, DO, [facility]  04/07/2022, 10:59 AM Office: [phone]  Pager: [phone]

## 2022-04-08 ENCOUNTER — Ambulatory Visit
Admission: EM | Admit: 2022-04-08 | Discharge: 2022-04-08 | Disposition: A | Discharge status: Home / Self Care | Payer: BC Managed Care – PPO

## 2022-04-08 DIAGNOSIS — M545 Low back pain, unspecified: Secondary | ICD-10-CM

## 2022-04-08 MED ORDER — PREDNISONE 20 MG PO TABS: 40.0000 mg | ORAL_TABLET | Freq: Every day | ORAL | 0 refills | Status: AC

## 2022-04-08 MED ORDER — TIZANIDINE HCL 4 MG PO TABS: 4.0000 mg | ORAL_TABLET | Freq: Four times a day (QID) | ORAL | 0 refills | Status: DC | PRN

## 2022-04-08 NOTE — ED Triage Notes (Signed)
Pt c/o back spasms since ~ Wednesday. Lower mid back.

## 2022-04-08 NOTE — ED Provider Notes (Signed)
[facility]    CSN: [id number] Arrival date & time: 04/08/22  1018      History   Chief Complaint Chief Complaint  Patient presents with   Back Pain    HPI Brent Massey is a 66 y.o. male.   Patient here today for evaluation of lower back pain that is been ongoing the last few days.  He reports that symptoms are waxing and waning.  He denies any known injury.  He notes muscle spasms at times.  He denies any numbness or tingling.  He has tried Aleve with mild improvement.  The history is provided by the patient.    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   BPH (benign prostatic hyperplasia)    Depression    GERD (gastroesophageal reflux disease)    Headache    migraines as teenager   Hypertension    Leukocytopenia    saw oncology first ,now followed by PCP- Dr. Lysle Rubens.   Neutropenia, unspecified ([place])    Plantar fascia syndrome    bilateral- intermittent    Patient Active Problem List   Diagnosis Date Noted   Leukocytopenia    Depression    Hypertension    BPH (benign prostatic hyperplasia)    GERD (gastroesophageal reflux disease)    Neutropenia, unspecified ([place])     Past Surgical History:  Procedure Laterality Date   COLONOSCOPY WITH PROPOFOL N/A 07/28/2015   Procedure: COLONOSCOPY WITH PROPOFOL;  Surgeon: Garlan Fair, MD;  Location: [facility];  Service: Endoscopy;  Laterality: N/A;   EYE SURGERY     laser surgery for retina tear   FRACTURE SURGERY     HERNIA REPAIR     INGUINAL EXPLORATION Right    "soft tissue mass removal" 05-26-15 [facility] [place]   KNEE SURGERY     WRIST FRACTURE SURGERY         Home Medications    Prior to Admission medications   Medication Sig Start Date End Date Taking? Authorizing Provider  losartan (COZAAR) 50 MG tablet Take 50 mg by mouth  daily.   Yes [provider]  predniSONE (DELTASONE) 20 MG tablet Take 2 tablets (40 mg total) by mouth daily with breakfast for 5 days. 04/08/22 04/13/22 Yes Francene Finders, PA-C  tiZANidine (ZANAFLEX) 4 MG tablet Take 1 tablet (4 mg total) by mouth every 6 (six) hours as needed for muscle spasms. 04/08/22  Yes Francene Finders, PA-C  Vitamin D, Ergocalciferol, (DRISDOL) 1.25 MG (50000 UNIT) CAPS capsule Take 50,000 Units by mouth every 7 (seven) days.   Yes [provider]  Cholecalciferol (VITAMIN D3) 250 MCG (10000 UT) capsule See admin instructions.    [provider]  escitalopram (LEXAPRO) 20 MG tablet Take 20 mg by mouth daily.    [provider]  furosemide (LASIX) 40 MG tablet Take 40 mg by mouth daily.    [provider]  loratadine (CLARITIN) 10 MG tablet Take 10 mg by mouth daily as needed for allergies. For allergies.    [provider]  mometasone (NASONEX) 50 MCG/ACT nasal spray Place 2 sprays into the nose daily as needed (allergies).     [provider]  Multiple Vitamin (MULTIVITAMIN WITH MINERALS) TABS Take 1 tablet by mouth daily.    [provider]  potassium  chloride (K-DUR,KLOR-CON) 10 MEQ tablet Take 10 mEq by mouth 2 (two) times daily.    [provider]  rosuvastatin (CRESTOR) 20 MG tablet Take 1 tablet (20 mg total) by mouth daily. 04/07/22 07/06/22  Custovic, Collene Mares, DO  saw palmetto 160 MG capsule Take 160 mg by mouth daily.    [provider]    Family History Family History  Problem Relation Age of Onset   Heart attack Mother    Heart attack Father 70   Depression Sister    Prostate cancer Brother    Depression Brother     Social History Social History   Tobacco Use   Smoking status: Never  Vaping Use   Vaping Use: Never used  Substance Use Topics   Alcohol use: No   Drug use: No     Allergies   Lyrica [pregabalin] and Neurontin [gabapentin]   Review of  Systems Review of Systems  Constitutional:  Negative for chills and fever.  Eyes:  Negative for discharge and redness.  Musculoskeletal:  Positive for back pain and myalgias.  Neurological:  Negative for numbness.     Physical Exam Triage Vital Signs ED Triage Vitals  Enc Vitals Group     BP      Pulse      Resp      Temp      Temp src      SpO2      Weight      Height      Head Circumference      Peak Flow      Pain Score      Pain Loc      Pain Edu?      Excl. in [place]?    No data found.  Updated Vital Signs BP 131/76 (BP Location: Left Arm)   Pulse 66   Temp 98.2 F (36.8 C) (Oral)   Resp 18   SpO2 97%   Physical Exam Vitals and nursing note reviewed.  Constitutional:      General: He is not in acute distress.    Appearance: Normal appearance. He is not ill-appearing.  HENT:     Head: Normocephalic and atraumatic.  Eyes:     Conjunctiva/sclera: Conjunctivae normal.  Cardiovascular:     Rate and Rhythm: Normal rate.  Pulmonary:     Effort: Pulmonary effort is normal.  Musculoskeletal:     Comments: No TTP to midline thoracic or lumbar spine, no TTP across low back  Neurological:     Mental Status: He is alert.  Psychiatric:        Mood and Affect: Mood normal.        Behavior: Behavior normal.        Thought Content: Thought content normal.      UC Treatments / Results  Labs (all labs ordered are listed, but only abnormal results are displayed) Labs Reviewed - No data to display  EKG   Radiology No results found.  Procedures Procedures (including critical care time)  Medications Ordered in UC Medications - No data to display  Initial Impression / Assessment and Plan / UC Course  I have reviewed the triage vital signs and the nursing notes.  Pertinent labs & imaging results that were available during my care of the patient were reviewed by me and considered in my medical decision making (see chart for details).    Suspect muscle  strain given reported muscle spasms and will treat with steroids and muscle  relaxer.  Discussed that muscle relaxer may cause drowsiness.  Encouraged follow-up with any further concerns.  Final Clinical Impressions(s) / UC Diagnoses   Final diagnoses:  Acute bilateral low back pain without sciatica   Discharge Instructions   None    ED Prescriptions     Medication Sig Dispense Auth. Provider   predniSONE (DELTASONE) 20 MG tablet Take 2 tablets (40 mg total) by mouth daily with breakfast for 5 days. 10 tablet Ewell Poe F, PA-C   tiZANidine (ZANAFLEX) 4 MG tablet Take 1 tablet (4 mg total) by mouth every 6 (six) hours as needed for muscle spasms. 30 tablet Francene Finders, PA-C      PDMP not reviewed this encounter.   Francene Finders, PA-C 04/08/22 1225

## 2022-04-19 DIAGNOSIS — H40023 Open angle with borderline findings, high risk, bilateral: Secondary | ICD-10-CM | POA: Diagnosis not present

## 2022-04-26 ENCOUNTER — Other Ambulatory Visit: Payer: BC Managed Care – PPO

## 2022-04-28 DIAGNOSIS — R972 Elevated prostate specific antigen [PSA]: Secondary | ICD-10-CM | POA: Diagnosis not present

## 2022-05-02 DIAGNOSIS — H2513 Age-related nuclear cataract, bilateral: Secondary | ICD-10-CM | POA: Diagnosis not present

## 2022-05-02 DIAGNOSIS — H33321 Round hole, right eye: Secondary | ICD-10-CM | POA: Diagnosis not present

## 2022-05-02 DIAGNOSIS — H25043 Posterior subcapsular polar age-related cataract, bilateral: Secondary | ICD-10-CM | POA: Diagnosis not present

## 2022-05-02 DIAGNOSIS — H25013 Cortical age-related cataract, bilateral: Secondary | ICD-10-CM | POA: Diagnosis not present

## 2022-05-02 DIAGNOSIS — H2511 Age-related nuclear cataract, right eye: Secondary | ICD-10-CM | POA: Diagnosis not present

## 2022-05-03 ENCOUNTER — Ambulatory Visit: Payer: BC Managed Care – PPO

## 2022-05-03 DIAGNOSIS — I495 Sick sinus syndrome: Secondary | ICD-10-CM | POA: Diagnosis not present

## 2022-05-03 DIAGNOSIS — R55 Syncope and collapse: Secondary | ICD-10-CM

## 2022-05-03 DIAGNOSIS — R42 Dizziness and giddiness: Secondary | ICD-10-CM | POA: Diagnosis not present

## 2022-05-06 ENCOUNTER — Other Ambulatory Visit: Payer: BC Managed Care – PPO

## 2022-05-16 DIAGNOSIS — H43811 Vitreous degeneration, right eye: Secondary | ICD-10-CM | POA: Diagnosis not present

## 2022-05-16 DIAGNOSIS — H3589 Other specified retinal disorders: Secondary | ICD-10-CM | POA: Diagnosis not present

## 2022-05-16 DIAGNOSIS — H43822 Vitreomacular adhesion, left eye: Secondary | ICD-10-CM | POA: Diagnosis not present

## 2022-05-16 DIAGNOSIS — H35461 Secondary vitreoretinal degeneration, right eye: Secondary | ICD-10-CM | POA: Diagnosis not present

## 2022-05-16 DIAGNOSIS — H35371 Puckering of macula, right eye: Secondary | ICD-10-CM | POA: Diagnosis not present

## 2022-05-16 DIAGNOSIS — H31091 Other chorioretinal scars, right eye: Secondary | ICD-10-CM | POA: Diagnosis not present

## 2022-05-18 ENCOUNTER — Ambulatory Visit: Payer: BC Managed Care – PPO

## 2022-05-18 DIAGNOSIS — R55 Syncope and collapse: Secondary | ICD-10-CM | POA: Diagnosis not present

## 2022-05-18 DIAGNOSIS — I495 Sick sinus syndrome: Secondary | ICD-10-CM

## 2022-05-18 DIAGNOSIS — R42 Dizziness and giddiness: Secondary | ICD-10-CM

## 2022-05-19 ENCOUNTER — Ambulatory Visit: Payer: BC Managed Care – PPO | Admitting: Internal Medicine

## 2022-05-19 ENCOUNTER — Encounter: Payer: Self-pay | Admitting: Internal Medicine

## 2022-05-19 VITALS — BP 131/73 | HR 78 | Temp 98.2°F | Resp 16 | Ht 75.0 in | Wt 213.0 lb

## 2022-05-19 DIAGNOSIS — E782 Mixed hyperlipidemia: Secondary | ICD-10-CM

## 2022-05-19 DIAGNOSIS — I10 Essential (primary) hypertension: Secondary | ICD-10-CM | POA: Diagnosis not present

## 2022-05-19 DIAGNOSIS — I872 Venous insufficiency (chronic) (peripheral): Secondary | ICD-10-CM | POA: Insufficient documentation

## 2022-05-19 NOTE — Progress Notes (Signed)
Primary Physician/Referring:  Wenda Low, MD  Patient ID: Brent Massey, male    DOB: 09-23-1955, 66 y.o.   MRN: [id number]  No chief complaint on file.  HPI:    Brent Massey  is a 66 y.o. male with past medical history significant for HTN, HLD, and venous insufficiency who is here for a follow-up visit. His nuclear stress test and echocardiogram were both normal. Patient is tolerating his medications without issue. He is wearing compression stockings and that seems to help his leg swelling. He believes his previous symptoms were due to anxiety. Denies chest pain, shortness of breath, palpitations, diaphoresis, syncope, edema, PND, orthopnea.    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   BPH (benign prostatic hyperplasia)    Depression    GERD (gastroesophageal reflux disease)    Headache    migraines as teenager   Hypertension    Leukocytopenia    saw oncology first ,now followed by PCP- Dr. Lysle Rubens.   Neutropenia, unspecified ([place])    Plantar fascia syndrome    bilateral- intermittent   Past Surgical History:  Procedure Laterality Date   COLONOSCOPY WITH PROPOFOL N/A 07/28/2015   Procedure: COLONOSCOPY WITH PROPOFOL;  Surgeon: Garlan Fair, MD;  Location: [facility];  Service: Endoscopy;  Laterality: N/A;   EYE SURGERY     laser surgery for retina tear   FRACTURE SURGERY     HERNIA REPAIR     INGUINAL EXPLORATION Right    "soft tissue mass removal" 05-26-15 [facility] [place]   KNEE SURGERY     WRIST FRACTURE SURGERY     Family History  Problem Relation Age of Onset   Heart attack Mother    Heart attack Father 63   Depression Sister    Prostate cancer Brother    Depression Brother     Social History   Tobacco Use   Smoking status: Never   Smokeless tobacco: Not on file  Substance Use Topics   Alcohol  use: No   Marital Status: Married  ROS  Review of Systems  Cardiovascular:  Positive for leg swelling. Negative for chest pain, irregular heartbeat, near-syncope, palpitations and syncope.  Neurological:  Negative for light-headedness.  All other systems reviewed and are negative.  Objective  Blood pressure 131/73, pulse 78, temperature 98.2 F (36.8 C), temperature source Temporal, resp. rate 16, height '6\' 3"'$  (1.905 m), weight 213 lb (96.6 kg), SpO2 96 %. Body mass index is 26.62 kg/m.     05/19/2022   11:15 AM 04/08/2022   10:48 AM 04/07/2022   10:38 AM  Vitals with BMI  Height '6\' 3"'$   '6\' 3"'$   Weight 213 lbs  214 lbs  BMI 23.53  61.44  Systolic 315 400   Diastolic 73 76   Pulse 78 66      Physical Exam Vitals reviewed.  Constitutional:      Appearance: Normal appearance.  HENT:     Head: Normocephalic and atraumatic.  Neck:     Vascular: No carotid bruit.  Cardiovascular:     Rate and Rhythm: Normal rate and regular rhythm.     Pulses: Normal pulses.     Heart sounds: Normal heart sounds. No murmur heard. Pulmonary:     Effort: Pulmonary effort is normal.     Breath sounds: Normal breath  sounds.  Abdominal:     General: Bowel sounds are normal.  Musculoskeletal:     Right lower leg: No edema.     Left lower leg: No edema.  Skin:    General: Skin is warm and dry.  Neurological:     Mental Status: He is alert.   Medications and allergies   Allergies  Allergen Reactions   Lyrica [Pregabalin] Swelling    Legs/ankle/feet swelling   Neurontin [Gabapentin] Swelling    Legs/ankle/feet swelling     Medication list after today's encounter   Current Outpatient Medications:    Cholecalciferol (VITAMIN D3) 250 MCG (10000 UT) capsule, See admin instructions., Disp: , Rfl:    escitalopram (LEXAPRO) 20 MG tablet, Take 20 mg by mouth daily., Disp: , Rfl:    furosemide (LASIX) 40 MG tablet, Take 40 mg by mouth daily., Disp: , Rfl:    loratadine (CLARITIN) 10 MG tablet,  Take 10 mg by mouth daily as needed for allergies. For allergies., Disp: , Rfl:    losartan (COZAAR) 50 MG tablet, Take 50 mg by mouth daily., Disp: , Rfl:    mometasone (NASONEX) 50 MCG/ACT nasal spray, Place 2 sprays into the nose daily as needed (allergies). , Disp: , Rfl:    Multiple Vitamin (MULTIVITAMIN WITH MINERALS) TABS, Take 1 tablet by mouth daily., Disp: , Rfl:    potassium chloride (K-DUR,KLOR-CON) 10 MEQ tablet, Take 10 mEq by mouth 2 (two) times daily., Disp: , Rfl:    rosuvastatin (CRESTOR) 20 MG tablet, Take 1 tablet (20 mg total) by mouth daily., Disp: 30 tablet, Rfl: 2   saw palmetto 160 MG capsule, Take 160 mg by mouth daily., Disp: , Rfl:    Vitamin D, Ergocalciferol, (DRISDOL) 1.25 MG (50000 UNIT) CAPS capsule, Take 50,000 Units by mouth every 7 (seven) days., Disp: , Rfl:    tiZANidine (ZANAFLEX) 4 MG tablet, Take 1 tablet (4 mg total) by mouth every 6 (six) hours as needed for muscle spasms. (Patient not taking: Reported on 05/19/2022), Disp: 30 tablet, Rfl: 0  Laboratory examination:   Lab Results  Component Value Date   NA 141 12/08/2013   K 3.3 (L) 12/08/2013   CO2 28 12/08/2013   GLUCOSE 96 12/08/2013   BUN 21 12/08/2013   CREATININE 1.25 12/08/2013   CALCIUM 8.7 12/08/2013   GFRNONAA 62 (L) 12/08/2013       Latest Ref Rng & Units 12/08/2013   10:38 AM 07/19/2013    1:47 PM 07/20/2012   11:55 AM  CMP  Glucose 70 - 99 mg/dL 96  149  89   BUN 6 - 23 mg/dL 21  18.0  18.0   Creatinine 0.50 - 1.35 mg/dL 1.25  1.2  1.1   Sodium 137 - 147 mEq/L 141  140  140   Potassium 3.7 - 5.3 mEq/L 3.3  4.2  4.2   Chloride 96 - 112 mEq/L 101   103   CO2 19 - 32 mEq/L '28  28  30   '$ Calcium 8.4 - 10.5 mg/dL 8.7  9.0  9.3   Total Protein 6.0 - 8.3 g/dL 6.8  7.1  6.7   Total Bilirubin 0.3 - 1.2 mg/dL 0.4  0.62  0.71   Alkaline Phos 39 - 117 U/L 69  71  64   AST 0 - 37 U/L 33  24  24   ALT 0 - 53 U/L '13  14  19       '$ Latest Ref Rng &  Units 11/09/2016    9:55 AM 12/08/2013    10:38 AM 12/07/2013   12:01 PM  CBC  WBC 4.0 - 10.3 10e3/uL 1.6  2.6  1.7   Hemoglobin 13.0 - 17.1 g/dL 13.8  13.3  14.2   Hematocrit 38.4 - 49.9 % 40.9  40.8  43.9   Platelets 140 - 400 10e3/uL 198  115      Lipid Panel No results for input(s): "CHOL", "TRIG", "[place]", "VLDL", "HDL", "CHOLHDL", "LDLDIRECT" in the last 8760 hours.  HEMOGLOBIN A1C No results found for: "HGBA1C", "MPG" TSH No results for input(s): "TSH" in the last 8760 hours.  External labs:     Radiology:    Cardiac Studies:   Exercise nuclear stress test 05/03/22 Myocardial perfusion is normal. Low risk study. Overall LV systolic function is normal without regional wall motion abnormalities. Stress LV EF: 57%.  Normal ECG stress. The patient exercised for 6 minutes and 36 seconds of a Bruce protocol, achieving approximately 7.96 METs and 118% MPHR. The heart rate response was normal. The blood pressure response was normal. No previous exam available for comparison.   Echocardiogram 05/18/2022:  Normal LV systolic function with visual EF 60-65%. Left ventricle cavity  is normal in size. Normal global wall motion. Normal diastolic filling  pattern, normal LAP.  Right atrial cavity is mildly dilated.  Structurally normal tricuspid valve with no regurgitation. No evidence of  pulmonary hypertension.  No prior available for comparison.    EKG:   04/07/22 EKG: sinus bradycardia, 58bpm    Assessment     ICD-10-CM   1. Primary hypertension  I10     2. Mixed hyperlipidemia  E78.2     3. Venous insufficiency  I87.2        No orders of the defined types were placed in this encounter.   No orders of the defined types were placed in this encounter.   There are no discontinued medications.    Recommendations:   Brent Massey is a 66 y.o. male with HTN, HLD, and venous insufficiency    Primary hypertension Discussed echo and stress test results, both normal Continue losartan  Mixed  hyperlipidemia Continue Crestor  Venous insufficiency Continue Lasix and compression stockings   Follow-up in 12 months or sooner if needed    Floydene Flock, DO, [facility]  05/19/2022, 11:41 AM Office: [phone] Pager: [phone]

## 2022-05-22 HISTORY — PX: EYE SURGERY: SHX253

## 2022-05-27 DIAGNOSIS — H40023 Open angle with borderline findings, high risk, bilateral: Secondary | ICD-10-CM | POA: Diagnosis not present

## 2022-05-31 DIAGNOSIS — F418 Other specified anxiety disorders: Secondary | ICD-10-CM | POA: Diagnosis not present

## 2022-05-31 DIAGNOSIS — H2512 Age-related nuclear cataract, left eye: Secondary | ICD-10-CM | POA: Diagnosis not present

## 2022-05-31 DIAGNOSIS — H2513 Age-related nuclear cataract, bilateral: Secondary | ICD-10-CM | POA: Diagnosis not present

## 2022-05-31 DIAGNOSIS — Z7982 Long term (current) use of aspirin: Secondary | ICD-10-CM | POA: Diagnosis not present

## 2022-05-31 DIAGNOSIS — E785 Hyperlipidemia, unspecified: Secondary | ICD-10-CM | POA: Diagnosis not present

## 2022-05-31 DIAGNOSIS — H25013 Cortical age-related cataract, bilateral: Secondary | ICD-10-CM | POA: Diagnosis not present

## 2022-05-31 DIAGNOSIS — I10 Essential (primary) hypertension: Secondary | ICD-10-CM | POA: Diagnosis not present

## 2022-05-31 DIAGNOSIS — H33321 Round hole, right eye: Secondary | ICD-10-CM | POA: Diagnosis not present

## 2022-05-31 DIAGNOSIS — Z79899 Other long term (current) drug therapy: Secondary | ICD-10-CM | POA: Diagnosis not present

## 2022-05-31 DIAGNOSIS — H25043 Posterior subcapsular polar age-related cataract, bilateral: Secondary | ICD-10-CM | POA: Diagnosis not present

## 2022-05-31 DIAGNOSIS — K219 Gastro-esophageal reflux disease without esophagitis: Secondary | ICD-10-CM | POA: Diagnosis not present

## 2022-05-31 DIAGNOSIS — Z7952 Long term (current) use of systemic steroids: Secondary | ICD-10-CM | POA: Diagnosis not present

## 2022-05-31 DIAGNOSIS — Z888 Allergy status to other drugs, medicaments and biological substances status: Secondary | ICD-10-CM | POA: Diagnosis not present

## 2022-06-01 DIAGNOSIS — H2511 Age-related nuclear cataract, right eye: Secondary | ICD-10-CM | POA: Diagnosis not present

## 2022-06-02 DIAGNOSIS — D075 Carcinoma in situ of prostate: Secondary | ICD-10-CM | POA: Diagnosis not present

## 2022-06-02 DIAGNOSIS — C61 Malignant neoplasm of prostate: Secondary | ICD-10-CM | POA: Diagnosis not present

## 2022-06-16 DIAGNOSIS — C61 Malignant neoplasm of prostate: Secondary | ICD-10-CM | POA: Diagnosis not present

## 2022-06-16 DIAGNOSIS — R972 Elevated prostate specific antigen [PSA]: Secondary | ICD-10-CM | POA: Diagnosis not present

## 2022-06-21 ENCOUNTER — Other Ambulatory Visit (HOSPITAL_COMMUNITY): Payer: Self-pay | Admitting: Urology

## 2022-06-21 DIAGNOSIS — Z79899 Other long term (current) drug therapy: Secondary | ICD-10-CM | POA: Diagnosis not present

## 2022-06-21 DIAGNOSIS — C61 Malignant neoplasm of prostate: Secondary | ICD-10-CM | POA: Diagnosis not present

## 2022-06-21 DIAGNOSIS — Z888 Allergy status to other drugs, medicaments and biological substances status: Secondary | ICD-10-CM | POA: Diagnosis not present

## 2022-06-21 DIAGNOSIS — Z961 Presence of intraocular lens: Secondary | ICD-10-CM | POA: Diagnosis not present

## 2022-06-21 DIAGNOSIS — F419 Anxiety disorder, unspecified: Secondary | ICD-10-CM | POA: Diagnosis not present

## 2022-06-21 DIAGNOSIS — K219 Gastro-esophageal reflux disease without esophagitis: Secondary | ICD-10-CM | POA: Diagnosis not present

## 2022-06-21 DIAGNOSIS — H2511 Age-related nuclear cataract, right eye: Secondary | ICD-10-CM | POA: Diagnosis not present

## 2022-06-21 DIAGNOSIS — Z9842 Cataract extraction status, left eye: Secondary | ICD-10-CM | POA: Diagnosis not present

## 2022-06-21 DIAGNOSIS — E785 Hyperlipidemia, unspecified: Secondary | ICD-10-CM | POA: Diagnosis not present

## 2022-06-21 DIAGNOSIS — F32A Depression, unspecified: Secondary | ICD-10-CM | POA: Diagnosis not present

## 2022-06-21 DIAGNOSIS — H2513 Age-related nuclear cataract, bilateral: Secondary | ICD-10-CM | POA: Diagnosis not present

## 2022-06-21 DIAGNOSIS — I10 Essential (primary) hypertension: Secondary | ICD-10-CM | POA: Diagnosis not present

## 2022-06-21 DIAGNOSIS — Z7982 Long term (current) use of aspirin: Secondary | ICD-10-CM | POA: Diagnosis not present

## 2022-06-22 DIAGNOSIS — Z961 Presence of intraocular lens: Secondary | ICD-10-CM | POA: Diagnosis not present

## 2022-06-22 DIAGNOSIS — H33321 Round hole, right eye: Secondary | ICD-10-CM | POA: Diagnosis not present

## 2022-06-30 ENCOUNTER — Encounter (HOSPITAL_COMMUNITY)
Admission: RE | Admit: 2022-06-30 | Discharge: 2022-06-30 | Disposition: A | Discharge status: Home / Self Care | Payer: BC Managed Care – PPO | Source: Ambulatory Visit | Attending: Urology | Admitting: Urology

## 2022-06-30 DIAGNOSIS — C61 Malignant neoplasm of prostate: Secondary | ICD-10-CM | POA: Insufficient documentation

## 2022-06-30 DIAGNOSIS — N281 Cyst of kidney, acquired: Secondary | ICD-10-CM | POA: Diagnosis not present

## 2022-06-30 MED ORDER — PIFLIFOLASTAT F 18 (PYLARIFY) INJECTION
9.0000 | Freq: Once | INTRAVENOUS | Status: AC
  Administered 2022-06-30: 9.2 via INTRAVENOUS

## 2022-07-05 ENCOUNTER — Encounter: Payer: Self-pay | Admitting: Emergency Medicine

## 2022-07-05 ENCOUNTER — Ambulatory Visit
Admission: EM | Admit: 2022-07-05 | Discharge: 2022-07-05 | Disposition: A | Discharge status: Home / Self Care | Payer: BC Managed Care – PPO | Attending: Internal Medicine | Admitting: Internal Medicine

## 2022-07-05 DIAGNOSIS — J069 Acute upper respiratory infection, unspecified: Secondary | ICD-10-CM | POA: Insufficient documentation

## 2022-07-05 DIAGNOSIS — C61 Malignant neoplasm of prostate: Secondary | ICD-10-CM | POA: Diagnosis not present

## 2022-07-05 DIAGNOSIS — Z1152 Encounter for screening for COVID-19: Secondary | ICD-10-CM | POA: Diagnosis not present

## 2022-07-05 LAB — RESP PANEL BY RT-PCR (RSV, FLU A&B, COVID)  RVPGX2
Influenza A by PCR: NEGATIVE
Influenza B by PCR: NEGATIVE
Resp Syncytial Virus by PCR: NEGATIVE
SARS Coronavirus 2 by RT PCR: NEGATIVE

## 2022-07-05 NOTE — ED Triage Notes (Signed)
Pt is present today with /co vomiting, fever, and cough. PT sx started Sunday

## 2022-07-05 NOTE — Discharge Instructions (Signed)
It appears that you have a viral illness that should run its course and self resolve with symptomatic treatment as we discussed.  COVID and flu test are pending.  We will call if they are positive.  Please follow-up if symptoms persist or worsen.

## 2022-07-05 NOTE — ED Provider Notes (Signed)
[facility]    CSN: [id number] Arrival date & time: 07/05/22  1151      History   Chief Complaint Chief Complaint  Patient presents with   Cough   Fever   Emesis    HPI Brent Massey is a 66 y.o. male.   Patient presents with nausea, vomiting, fever, cough, nasal congestion.  His symptoms started about 3 days ago.  He reports that he had 1 episode of nonbloody emesis that is now resolved.  He has nasal congestion that has now resolved as well.  He states that his coworkers have had similar symptoms but they tested negative for COVID-19.  He is not sure of Tmax at home but states that he "felt feverish".  Denies chest pain, shortness of breath, sore throat, ear pain, diarrhea, abdominal pain.  Denies any formal diagnosis of asthma or COPD.   Cough Fever Emesis   Past Medical History:  Diagnosis Date   Allergy    Anemia    iron anemia"took oral iron"-years ago   Back pain    lower back "pinched nerve"-being evaluated- numbness left foot toes.   BPH (benign prostatic hyperplasia)    Depression    GERD (gastroesophageal reflux disease)    Headache    migraines as teenager   Hypertension    Leukocytopenia    saw oncology first ,now followed by PCP- Dr. Lysle Rubens.   Neutropenia, unspecified ([place])    Plantar fascia syndrome    bilateral- intermittent    Patient Active Problem List   Diagnosis Date Noted   Mixed hyperlipidemia 05/19/2022   Venous insufficiency 05/19/2022   Leukocytopenia    Depression    Hypertension    BPH (benign prostatic hyperplasia)    GERD (gastroesophageal reflux disease)    Neutropenia, unspecified ([place])     Past Surgical History:  Procedure Laterality Date   COLONOSCOPY WITH PROPOFOL N/A 07/28/2015   Procedure: COLONOSCOPY WITH PROPOFOL;  Surgeon: Garlan Fair, MD;  Location: [facility];  Service: Endoscopy;  Laterality: N/A;   EYE SURGERY     laser surgery for retina tear   FRACTURE SURGERY     HERNIA REPAIR      INGUINAL EXPLORATION Right    "soft tissue mass removal" 05-26-15 [facility] [place]   KNEE SURGERY     WRIST FRACTURE SURGERY         Home Medications    Prior to Admission medications   Medication Sig Start Date End Date Taking? Authorizing Provider  Cholecalciferol (VITAMIN D3) 250 MCG (10000 UT) capsule See admin instructions.    [provider]  escitalopram (LEXAPRO) 20 MG tablet Take 20 mg by mouth daily.    [provider]  furosemide (LASIX) 40 MG tablet Take 40 mg by mouth daily.    [provider]  loratadine (CLARITIN) 10 MG tablet Take 10 mg by mouth daily as needed for allergies. For allergies.    [provider]  losartan (COZAAR) 50 MG tablet Take 50 mg by mouth daily.    [provider]  mometasone (NASONEX) 50 MCG/ACT nasal spray Place 2 sprays into the nose daily as needed (allergies).     [provider]  Multiple Vitamin (MULTIVITAMIN WITH MINERALS) TABS Take 1 tablet by mouth daily.    [provider]  potassium chloride (K-DUR,KLOR-CON) 10 MEQ tablet Take 10 mEq by mouth 2 (two) times daily.    [provider]  rosuvastatin (CRESTOR) 20 MG tablet Take 1 tablet (  20 mg total) by mouth daily. 04/07/22 07/06/22  Custovic, Collene Mares, DO  saw palmetto 160 MG capsule Take 160 mg by mouth daily.    [provider]  tiZANidine (ZANAFLEX) 4 MG tablet Take 1 tablet (4 mg total) by mouth every 6 (six) hours as needed for muscle spasms. Patient not taking: Reported on 05/19/2022 04/08/22   Francene Finders, PA-C  Vitamin D, Ergocalciferol, (DRISDOL) 1.25 MG (50000 UNIT) CAPS capsule Take 50,000 Units by mouth every 7 (seven) days.    [provider]    Family History Family History  Problem Relation Age of Onset   Heart attack Mother    Heart attack Father 65   Depression Sister    Prostate cancer Brother    Depression Brother     Social History Social History   Tobacco Use    Smoking status: Never  Vaping Use   Vaping Use: Never used  Substance Use Topics   Alcohol use: No   Drug use: No     Allergies   Lyrica [pregabalin] and Neurontin [gabapentin]   Review of Systems Review of Systems Per HPI  Physical Exam Triage Vital Signs ED Triage Vitals  Enc Vitals Group     BP 07/05/22 1221 118/71     Pulse Rate 07/05/22 1221 84     Resp 07/05/22 1221 18     Temp 07/05/22 1221 98.1 F (36.7 C)     Temp src --      SpO2 07/05/22 1221 96 %     Weight --      Height --      Head Circumference --      Peak Flow --      Pain Score 07/05/22 1220 0     Pain Loc --      Pain Edu? --      Excl. in [place]? --    No data found.  Updated Vital Signs BP 118/71   Pulse 84   Temp 98.1 F (36.7 C)   Resp 18   SpO2 96%   Visual Acuity Right Eye Distance:   Left Eye Distance:   Bilateral Distance:    Right Eye Near:   Left Eye Near:    Bilateral Near:     Physical Exam Constitutional:      General: He is not in acute distress.    Appearance: Normal appearance. He is not toxic-appearing or diaphoretic.  HENT:     Head: Normocephalic and atraumatic.     Right Ear: Tympanic membrane and ear canal normal.     Left Ear: Tympanic membrane and ear canal normal.     Nose: Congestion present.     Mouth/Throat:     Mouth: Mucous membranes are moist.     Pharynx: No posterior oropharyngeal erythema.  Eyes:     Extraocular Movements: Extraocular movements intact.     Conjunctiva/sclera: Conjunctivae normal.     Pupils: Pupils are equal, round, and reactive to light.  Cardiovascular:     Rate and Rhythm: Normal rate and regular rhythm.     Pulses: Normal pulses.     Heart sounds: Normal heart sounds.  Pulmonary:     Effort: Pulmonary effort is normal. No respiratory distress.     Breath sounds: Normal breath sounds. No stridor. No wheezing, rhonchi or rales.  Abdominal:     General: Abdomen is flat. Bowel sounds are normal.     Palpations: Abdomen is  soft.  Musculoskeletal:  General: Normal range of motion.     Cervical back: Normal range of motion.  Skin:    General: Skin is warm and dry.  Neurological:     General: No focal deficit present.     Mental Status: He is alert and oriented to person, place, and time. Mental status is at baseline.  Psychiatric:        Mood and Affect: Mood normal.        Behavior: Behavior normal.      UC Treatments / Results  Labs (all labs ordered are listed, but only abnormal results are displayed) Labs Reviewed  RESP PANEL BY RT-PCR (RSV, FLU A&B, COVID)  [id number]    EKG   Radiology No results found.  Procedures Procedures (including critical care time)  Medications Ordered in UC Medications - No data to display  Initial Impression / Assessment and Plan / UC Course  I have reviewed the triage vital signs and the nursing notes.  Pertinent labs & imaging results that were available during my care of the patient were reviewed by me and considered in my medical decision making (see chart for details).     Patient presents with symptoms likely from a viral upper respiratory infection. Differential includes bacterial pneumonia, sinusitis, allergic rhinitis, covid 19, flu, RSV. Do not suspect underlying cardiopulmonary process. Symptoms seem unlikely related to ACS, CHF or COPD exacerbations, pneumonia, pneumothorax. Patient is nontoxic appearing and not in need of emergent medical intervention.  Viral testing pending.  Recommended symptom control with over the counter medications. Discussed safe supportive care and symptom management with patient.   Return if symptoms fail to improve in 1-2 weeks or you develop shortness of breath, chest pain, severe headache. Patient states understanding and is agreeable.  Discharged with PCP followup.  Final Clinical Impressions(s) / UC Diagnoses   Final diagnoses:  Viral upper respiratory tract infection with cough     Discharge  Instructions      It appears that you have a viral illness that should run its course and self resolve with symptomatic treatment as we discussed.  COVID and flu test are pending.  We will call if they are positive.  Please follow-up if symptoms persist or worsen.    ED Prescriptions   None    PDMP not reviewed this encounter.   Teodora Medici, [place] 07/05/22 1247

## 2022-07-06 ENCOUNTER — Other Ambulatory Visit: Payer: Self-pay | Admitting: Urology

## 2022-07-06 DIAGNOSIS — C61 Malignant neoplasm of prostate: Secondary | ICD-10-CM

## 2022-07-08 ENCOUNTER — Other Ambulatory Visit: Payer: Self-pay | Admitting: Internal Medicine

## 2022-07-12 ENCOUNTER — Other Ambulatory Visit: Payer: Self-pay | Admitting: Urology

## 2022-07-18 DIAGNOSIS — H35371 Puckering of macula, right eye: Secondary | ICD-10-CM | POA: Diagnosis not present

## 2022-07-18 DIAGNOSIS — H31091 Other chorioretinal scars, right eye: Secondary | ICD-10-CM | POA: Diagnosis not present

## 2022-07-18 DIAGNOSIS — H35033 Hypertensive retinopathy, bilateral: Secondary | ICD-10-CM | POA: Diagnosis not present

## 2022-07-18 DIAGNOSIS — H43813 Vitreous degeneration, bilateral: Secondary | ICD-10-CM | POA: Diagnosis not present

## 2022-07-19 DIAGNOSIS — C61 Malignant neoplasm of prostate: Secondary | ICD-10-CM | POA: Diagnosis not present

## 2022-07-19 DIAGNOSIS — M6281 Muscle weakness (generalized): Secondary | ICD-10-CM | POA: Diagnosis not present

## 2022-07-21 DIAGNOSIS — N393 Stress incontinence (female) (male): Secondary | ICD-10-CM | POA: Diagnosis not present

## 2022-07-21 DIAGNOSIS — M6281 Muscle weakness (generalized): Secondary | ICD-10-CM | POA: Diagnosis not present

## 2022-07-21 DIAGNOSIS — M62838 Other muscle spasm: Secondary | ICD-10-CM | POA: Diagnosis not present

## 2022-07-21 NOTE — Patient Instructions (Addendum)
SURGICAL WAITING ROOM VISITATION Patients having surgery or a procedure may have no more than 2 support people in the waiting area - these visitors may rotate in the visitor waiting room.   Children under the age of 22 must have an adult with them who is not the patient. If the patient needs to stay at the hospital during part of their recovery, the visitor guidelines for inpatient rooms apply.  PRE-OP VISITATION  Pre-op nurse will coordinate an appropriate time for 1 support person to accompany the patient in pre-op.  This support person may not rotate.  This visitor will be contacted when the time is appropriate for the visitor to come back in the pre-op area.  Please refer to the [facility] website for the visitor guidelines for Inpatients (after your surgery is over and you are in a regular room).  You are not required to quarantine at this time prior to your surgery. However, you must do this: Hand Hygiene often Do NOT share personal items Notify your provider if you are in close contact with someone who has COVID or you develop fever 100.4 or greater, new onset of sneezing, cough, sore throat, shortness of breath or body aches.  If you test positive for Covid or have been in contact with anyone that has tested positive in the last 10 days please notify you surgeon.    Your procedure is scheduled on:  Thursday   August 04, 2022  Report to [facility] Main Entrance: [place] entrance where the [employer] is available.   Report to admitting at: 05:15  AM  +++++Call this number if you have any questions or problems the morning of surgery [phone]  DO NOT EAT OR DRINK ANYTHING AFTER MIDNIGHT THE NIGHT PRIOR TO YOUR SURGERY / PROCEDURE.   FOLLOW BOWEL PREP AND ANY ADDITIONAL PRE OP INSTRUCTIONS YOU RECEIVED FROM YOUR SURGEON'S OFFICE!!!  Magnesium Citrate-  Drink one (1) 8 ounce bottle at [facility] the day before your surgery.  Fleet enema-  Use one (1) enema the evening  prior to surgery   Oral Hygiene is also important to reduce your risk of infection.        Remember - BRUSH YOUR TEETH THE MORNING OF SURGERY WITH YOUR REGULAR TOOTHPASTE  Take ONLY these medicines the morning of surgery with A SIP OF WATER: Escitalopram (Lexapro)  You may not have any metal on your body including  jewelry, and body piercing  Do not wear lotions, powders, cologne, or deodorant  Men may shave face and neck.  You may bring a small overnight bag with you on the day of surgery, only pack items that are not valuable .[facility] IS NOT RESPONSIBLE   FOR VALUABLES THAT ARE LOST OR STOLEN.   Do not bring your home medications to the hospital. The Pharmacy will dispense medications listed on your medication list to you during your admission in the Hospital.   Please read over the following fact sheets you were given: IF YOU HAVE QUESTIONS ABOUT YOUR PRE-OP INSTRUCTIONS, PLEASE CALL [phone]  ([place])   [facility] - Preparing for Surgery Before surgery, you can play an important role.  Because skin is not sterile, your skin needs to be as free of germs as possible.  You can reduce the number of germs on your skin by washing with CHG (chlorahexidine gluconate) soap before surgery.  CHG is an antiseptic cleaner which kills germs and bonds with the skin to continue killing germs even after  washing. Please DO NOT use if you have an allergy to CHG or antibacterial soaps.  If your skin becomes reddened/irritated stop using the CHG and inform your nurse when you arrive at Short Stay. Do not shave (including legs and underarms) for at least 48 hours prior to the first CHG shower.  You may shave your face/neck.  Please follow these instructions carefully:  1.  Shower with CHG Soap the night before surgery and the  morning of surgery.  2.  If you choose to wash your hair, wash your hair first as usual with your normal  shampoo.  3.  After you shampoo, rinse your hair and body thoroughly  to remove the shampoo.                             4.  Use CHG as you would any other liquid soap.  You can apply chg directly to the skin and wash.  Gently with a scrungie or clean washcloth.  5.  Apply the CHG Soap to your body ONLY FROM THE NECK DOWN.   Do not use on face/ open                           Wound or open sores. Avoid contact with eyes, ears mouth and genitals (private parts).                       Wash face,  Genitals (private parts) with your normal soap.             6.  Wash thoroughly, paying special attention to the area where your  surgery  will be performed.  7.  Thoroughly rinse your body with warm water from the neck down.  8.  DO NOT shower/wash with your normal soap after using and rinsing off the CHG Soap.            9.  Pat yourself dry with a clean towel.            10.  Wear clean pajamas.            11.  Place clean sheets on your bed the night of your first shower and do not  sleep with pets.  ON THE DAY OF SURGERY : Do not apply any lotions/deodorants the morning of surgery.  Please wear clean clothes to the hospital/surgery center.    FAILURE TO FOLLOW THESE INSTRUCTIONS MAY RESULT IN THE CANCELLATION OF YOUR SURGERY  PATIENT SIGNATURE_________________________________  NURSE SIGNATURE__________________________________  ________________________________________________________________________

## 2022-07-21 NOTE — Progress Notes (Addendum)
COVID Vaccine received:  _0  No _1  Yes Date of any COVID positive Test in last 90 days: None  PCP - Wenda Low, MD Cardiologist - Floydene Flock, DO at [facility] CV  Chest x-ray -  EKG -  04-07-2022 epic Stress Test - 05-06-2022  Lexiscan ECHO - 05-19-2022 epic Cardiac Cath -   PCR screen: _2  Ordered & Completed                      _3   No Order but Needs PROFEND                      _4   N/A for this surgery  Surgery Plan:  _5  Ambulatory                            _6  Outpatient in bed                            _7  Admit  Anesthesia:    _8  General  _9  Spinal                           _10   Choice _11   MAC  Bowel Prep - _12  No  _13   Yes __Magnesium Citrate- Drink 8 ounces at [facility] the day before surgery.  Fleet Enema- use the evening prior to surgery.  _____Patient voiced understanding______  Pacemaker / ICD device _14  No _15  Yes        Device order form faxed _16  No    _17   Yes      Faxed to:  History of Sleep Apnea? _18  No _19  Yes   CPAP used?- _20  No _21  Yes    Does the patient monitor blood sugar? _22  No _23  Yes  _24  N/A  Blood Thinner / Instructions: none Aspirin Instructions: none  ERAS Protocol Ordered: _25  No  _26  Yes Patient is to be NPO after:  Midnight prior  Activity level: Patient can climb a flight of stairs without difficulty; _27  No CP  _28  No SOB   Patient can perform ADLs without assistance.   Anesthesia review: HTN, GERD, anemia, leukocytopenia  Patient denies shortness of breath, fever, cough and chest pain at PAT appointment.  Patient verbalized understanding and agreement to the Pre-Surgical Instructions that were given to them at this PAT appointment. Patient was also educated of the need to review these PAT instructions again prior to his/her surgery.I reviewed the appropriate phone numbers to call if they have any and questions or concerns.

## 2022-07-22 ENCOUNTER — Other Ambulatory Visit: Payer: Self-pay

## 2022-07-22 ENCOUNTER — Encounter (HOSPITAL_COMMUNITY)
Admission: RE | Admit: 2022-07-22 | Discharge: 2022-07-22 | Disposition: A | Discharge status: Home / Self Care | Payer: BC Managed Care – PPO | Source: Ambulatory Visit | Attending: Urology | Admitting: Urology

## 2022-07-22 ENCOUNTER — Encounter (HOSPITAL_COMMUNITY): Payer: Self-pay

## 2022-07-22 DIAGNOSIS — Z01812 Encounter for preprocedural laboratory examination: Secondary | ICD-10-CM | POA: Insufficient documentation

## 2022-07-22 HISTORY — DX: Unspecified osteoarthritis, unspecified site: M19.90

## 2022-07-22 HISTORY — DX: Malignant (primary) neoplasm, unspecified: C80.1

## 2022-07-22 LAB — BASIC METABOLIC PANEL
Anion gap: 3 — ABNORMAL LOW (ref 5–15)
BUN: 22 mg/dL (ref 8–23)
CO2: 30 mmol/L (ref 22–32)
Calcium: 8.9 mg/dL (ref 8.9–10.3)
Chloride: 104 mmol/L (ref 98–111)
Creatinine, Ser: 1.07 mg/dL (ref 0.61–1.24)
GFR, Estimated: >60 mL/min (ref >60)
Glucose, Bld: 170 mg/dL — ABNORMAL HIGH (ref 70–99)
Potassium: 3.7 mmol/L (ref 3.5–5.1)
Sodium: 137 mmol/L (ref 135–145)

## 2022-07-22 LAB — CBC
HCT: 43.5 % (ref 39.0–52.0)
Hemoglobin: 13.7 g/dL (ref 13.0–17.0)
MCH: 31.3 pg (ref 26.0–34.0)
MCHC: 31.5 g/dL (ref 30.0–36.0)
MCV: 99.3 fL (ref 80.0–100.0)
Platelets: 266 10*3/uL (ref 150–400)
RBC: 4.38 MIL/uL (ref 4.22–5.81)
RDW: 13.3 % (ref 11.5–15.5)
WBC: 1.7 10*3/uL — ABNORMAL LOW (ref 4.0–10.5)
nRBC: 0 % (ref 0.0–0.2)

## 2022-07-22 LAB — TYPE AND SCREEN
ABO/RH(D): O POS
Antibody Screen: NEGATIVE

## 2022-07-27 ENCOUNTER — Ambulatory Visit (HOSPITAL_COMMUNITY)
Admission: RE | Admit: 2022-07-27 | Discharge: 2022-07-27 | Disposition: A | Discharge status: Home / Self Care | Payer: BC Managed Care – PPO | Source: Ambulatory Visit | Attending: Urology | Admitting: Urology

## 2022-07-27 DIAGNOSIS — C61 Malignant neoplasm of prostate: Secondary | ICD-10-CM | POA: Diagnosis not present

## 2022-07-27 DIAGNOSIS — R972 Elevated prostate specific antigen [PSA]: Secondary | ICD-10-CM | POA: Diagnosis not present

## 2022-07-27 MED ORDER — GADOBUTROL 1 MMOL/ML IV SOLN
10.0000 mL | Freq: Once | INTRAVENOUS | Status: AC | PRN
  Administered 2022-07-27: 10 mL via INTRAVENOUS

## 2022-08-03 NOTE — Anesthesia Preprocedure Evaluation (Signed)
Anesthesia Evaluation  Patient identified by MRN, date of birth, ID band Patient awake    Reviewed: Allergy & Precautions, NPO status , Patient's Chart, lab work & pertinent test results  History of Anesthesia Complications Negative for: history of anesthetic complications  Airway Mallampati: II  TM Distance: >3 FB Neck ROM: Full    Dental  (+) Dental Advisory Given   Pulmonary neg pulmonary ROS   Pulmonary exam normal        Cardiovascular METS: 7 - 9 Mets hypertension, Normal cardiovascular exam  Echo 05/18/22: EF 60-65%  Stress test 05/03/22: normal perfusion, low risk, EF 57%, 7.96 METS achieved   Neuro/Psych negative neurological ROS     GI/Hepatic Neg liver ROS,GERD  ,,  Endo/Other  negative endocrine ROS    Renal/GU negative Renal ROS   Prostate cancer    Musculoskeletal  (+) Arthritis ,    Abdominal   Peds  Hematology negative hematology ROS (+)   Anesthesia Other Findings   Reproductive/Obstetrics                             Anesthesia Physical Anesthesia Plan  ASA: 2  Anesthesia Plan: General   Post-op Pain Management: Ofirmev IV (intra-op)* and Toradol IV (intra-op)*   Induction: Intravenous  PONV Risk Score and Plan: 3 and Ondansetron, Dexamethasone, Treatment may vary due to age or medical condition and Midazolam  Airway Management Planned: Oral ETT  Additional Equipment: None  Intra-op Plan:   Post-operative Plan: Extubation in OR  Informed Consent: I have reviewed the patients History and Physical, chart, labs and discussed the procedure including the risks, benefits and alternatives for the proposed anesthesia with the patient or authorized representative who has indicated his/her understanding and acceptance.     Dental advisory given  Plan Discussed with:   Anesthesia Plan Comments:         Anesthesia Quick Evaluation

## 2022-08-03 NOTE — H&P (Signed)
Office Visit Report     07/05/2022   --------------------------------------------------------------------------------   Brent Massey  MRN: [id number]  DOB: 1955-12-13, 66 year old Male  SSN:    PRIMARY CARE:  Wenda Low, MD  REFERRING:  Olene Craven. Cain Sieve, MD  PROVIDER:  Olene Craven. Cain Sieve, M.D.  TREATING:  Raynelle Bring, M.D.  LOCATION:  [facility] [phone]     --------------------------------------------------------------------------------   CC/HPI: CC: Prostate Cancer   Physician requesting consult: Dr. Donald Pore  PCP: Dr. Wenda Low   Brent Massey is a 66 year old gentleman who was found to have an elevated PSA of 5.68 that prompted a TRUS biopsy of the prostate on 06/02/22. 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   Family history: Brother. His brother was in his mid 67s and was treated with a radical prostatectomy and is doing well.   Imaging studies: PSMA PET scan (06/30/2022) - negative for metastatic disease   PMH: He has a history of hypertension and hyperlipidemia.  PSH: He apparently underwent an open inguinal hernia repair in 2000 and a subsequent laparoscopic hernia repair in 2016.   TNM stage: cT1 N0 M0  PSA: 5.68  Gleason score: 4+5=9 (GG 5)  Biopsy (06/02/22): 6/12 cores positive  Left: L apex (10%, 3+3=6)  Right: R lateral apex (30%, 3+4=7), R mid (5%, 4+5=9), R lateral mid (10%, 4+5=9), R base (30%, 4+3=7), R lateral base (5%, 3+3=6)  Prostate volume: 42.1 cc   Nomogram  OC disease: 21%  EPE: 76%  SVI: 28%  LNI: 25%  PFS (5 year, 10 year): 44%, 30%   Urinary function: IPSS is 15.  Erectile function: SHIM score is 18.     ALLERGIES: Gabapentin - Swelling Lyrica - Swelling, Anemia    MEDICATIONS: Aspirin  Escitalopram Oxalate 20 mg tablet  Furosemide 40 mg tablet  Loratadine 10 mg tablet  Losartan Potassium 50 mg tablet  Multivitamin  Nasonex  Potassium  Chloride 10 meq capsule, extended release  Rosuvastatin Calcium 20 mg tablet  Saw Palmetto 160 mg capsule  Vitamind3     GU PSH: Prostate Needle Biopsy - 06/02/2022     NON-GU PSH: Cataract surgery Hernia Repair Knee Arthroscopy, Right Surgical Pathology, Gross And Microscopic Examination For Prostate Needle - 06/02/2022 Wrist Arthroscopy/surgery     GU PMH: Elevated PSA - 06/16/2022, - 06/02/2022, - 04/28/2022 Prostate Cancer - 06/16/2022    NON-GU PMH: Anxiety Arthritis Depression Hypercholesterolemia Hypertension    FAMILY HISTORY: Kidney Stones - Mother Myocardial Infarction - Father, Mother Prostate Cancer - Brother stroke - Father   SOCIAL HISTORY: Marital Status: Married Preferred Language: English; Ethnicity: Not Hispanic Or Latino; Race: White, Black or African American Current Smoking Status: Patient has never smoked.   Tobacco Use Assessment Completed: Used Tobacco in last 30 days? Has never drank.  Does not drink caffeine.    REVIEW OF SYSTEMS:    GU Review Male:   Patient denies frequent urination, hard to postpone urination, burning/ pain with urination, get up at night to urinate, leakage of urine, stream starts and stops, trouble starting your streams, and have to strain to urinate .  Gastrointestinal (Lower):   Patient denies diarrhea and constipation.  Gastrointestinal (Upper):   Patient denies nausea and vomiting.  Constitutional:   Patient denies fever, night sweats, weight loss, and fatigue.  Skin:   Patient denies itching and skin rash/ lesion.  Eyes:   Patient denies  blurred vision and double vision.  Ears/ Nose/ Throat:   Patient denies sore throat and sinus problems.  Hematologic/Lymphatic:   Patient denies swollen glands and easy bruising.  Cardiovascular:   Patient denies leg swelling and chest pains.  Respiratory:   Patient denies cough and shortness of breath.  Endocrine:   Patient denies excessive thirst.  Musculoskeletal:   Patient  denies back pain and joint pain.  Neurological:   Patient denies headaches and dizziness.  Psychologic:   Patient denies depression and anxiety.   VITAL SIGNS:      07/05/2022 03:19 PM  Weight 214 lb / 97.07 kg  Height 75 in / 190.5 cm  BP 118/65 mmHg  Pulse 78 /min  Temperature 97.8 F / 36.5 C  BMI 26.7 kg/m   GU PHYSICAL EXAMINATION:    Prostate: Prostate about 50 grams. Left lobe normal consistency, right lobe normal consistency. Symmetrical lobes. No prostate nodule. Left lobe no tenderness, right lobe no tenderness.    MULTI-SYSTEM PHYSICAL EXAMINATION:    Constitutional: Well-nourished. No physical deformities. Normally developed. Good grooming.  Respiratory: No labored breathing, no use of accessory muscles. Clear bilaterally.  Cardiovascular: Normal temperature, normal extremity pulses, no swelling, no varicosities. Regular rate and rhythm.  Gastrointestinal: No mass, no tenderness, no rigidity, non obese abdomen.      Complexity of Data:  Lab Test Review:   PSA  Records Review:   Pathology Reports, Previous Patient Records  X-Ray Review: PET- PSMA Scan: Reviewed Films.     04/28/22  PSA  Total PSA 5.68 ng/mL   Notes:                     CLINICAL DATA: Recently diagnosed high-risk prostate carcinoma.  Staging.   EXAM:  NUCLEAR MEDICINE PET SKULL BASE TO THIGH   TECHNIQUE:  9.2 mCi F18 Piflufolastat (Pylarify) was injected intravenously.  Full-ring PET imaging was performed from the skull base to thigh  after the radiotracer. CT data was obtained and used for attenuation  correction and anatomic localization.   COMPARISON: CTA on 12/10/2015   FINDINGS:  NECK   No radiotracer activity in neck lymph nodes.   Incidental CT finding: None.   CHEST   No radiotracer accumulation within mediastinal or hilar lymph nodes.  No suspicious pulmonary nodules on the CT scan.   Incidental CT finding: Stable elevation of left hemidiaphragm.  Aortic atherosclerotic  calcification incidentally noted.   ABDOMEN/PELVIS   Prostate: Several foci of radiotracer uptake are seen within  prostate gland bilaterally, with highest SUV max measuring 15.3.   Lymph nodes: No abnormal radiotracer accumulation within pelvic or  abdominal nodes.   Liver: No evidence of liver metastasis.   Incidental CT finding: Several large benign-appearing left renal  cysts are seen which show no radiotracer uptake. Surgical mesh seen  within the lower anterior abdominal wall and suprapubic soft  tissues. Mild asymmetric soft tissue density is seen in the right  inguinal region, consistent with postop changes from previous  inguinal hernia repair. This shows no evidence of radiotracer  uptake.   SKELETON   No focal activity to suggest skeletal metastasis.   IMPRESSION:  Several foci of radiotracer uptake within prostate gland  bilaterally, consistent with known primary prostate carcinoma.   No evidence of metastatic disease.   Postop changes from right inguinal hernia repair. No evidence of  recurrent hernia.   Aortic Atherosclerosis (ICD10-I70.0).    Electronically Signed  By: Myles Rosenthal.D.  On: 07/04/2022 08:47   PROCEDURES:          Urinalysis w/Scope Dipstick Dipstick Cont'd Micro  Color: Amber Bilirubin: Neg mg/dL WBC/hpf: NS (Not Seen)  Appearance: Clear Ketones: Neg mg/dL RBC/hpf: 3 - 10/hpf  Specific Gravity: 1.020 Blood: 3+ ery/uL Bacteria: NS (Not Seen)  pH: 5.5 Protein: Neg mg/dL Cystals: NS (Not Seen)  Glucose: Neg mg/dL Urobilinogen: 0.2 mg/dL Casts: NS (Not Seen)    Nitrites: Neg Trichomonas: Not Present    Leukocyte Esterase: Neg leu/uL Mucous: Present      Epithelial Cells: 6 - 10/hpf      Yeast: NS (Not Seen)      Sperm: Not Present    ASSESSMENT:      ICD-10 Details  1 GU:   Prostate Cancer - C61    PLAN:           Orders         Schedule X-Rays: 3 Weeks - MRI Prostate GSORAD With and Without I.V. Contrast - High risk  prostate cancer. Evaluate for extraprostatic extension. Please coordinate with surgery scheduling to have done the week prior to his surgery date. Thank you.  Return Visit/Planned Activity: Other See Visit Notes             Note: Will call to schedule surgery.  Return Visit/Planned Activity: Next Available Appointment - PT Referral             Note: Please schedule patient for preoperative PT prior to radical prostatectomy.            Document Letter(s):  Created for Patient: Clinical Summary         Notes:   1. High risk prostate cancer: I had a detailed discussion with Mr. Geurin today regarding his prostate cancer situation. Considering his age and life expectancy as well as his disease parameters, I did recommend therapy of curative intent. The patient was counseled about the natural history of prostate cancer and the standard treatment options that are available for prostate cancer. It was explained to him how his age and life expectancy, clinical stage, Gleason score/prognostic grade group, and PSA (and PSA density) affect his prognosis, the decision to proceed with additional staging studies, as well as how that information influences recommended treatment strategies. We discussed the roles for active surveillance, radiation therapy, surgical therapy, androgen deprivation, as well as ablative therapy and other investigational options for the treatment of prostate cancer as appropriate to his individual cancer situation. We discussed the risks and benefits of these options with regard to their impact on cancer control and also in terms of potential adverse events, complications, and impact on quality of life particularly related to urinary and sexual function. The patient was encouraged to ask questions throughout the discussion today and all questions were answered to his stated satisfaction. In addition, the patient was provided with and/or directed to appropriate resources and literature for  further education about prostate cancer and treatment options.   We discussed surgical therapy for prostate cancer including the different available surgical approaches. We discussed, in detail, the risks and expectations of surgery with regard to cancer control, urinary control, and erectile function as well as the expected postoperative recovery process. Additional risks of surgery including but not limited to bleeding, infection, hernia formation, nerve damage, lymphocele formation, bowel/rectal injury potentially necessitating colostomy, damage to the urinary tract resulting in urine leakage, urethral stricture, and the cardiopulmonary risks such as myocardial infarction, stroke, death, venothromboembolism, etc. were explained.  The risk of open surgical conversion for robotic/laparoscopic prostatectomy was also discussed.   After further discussion, he does confirm his decision to want to proceed with surgical treatment. I did offer him a radiation oncology consultation which he declines currently. Considering his high risk and higher volume disease on the right side of the prostate, we did discuss options for nerve sparing. He is interested in trying to preserve his erectile function although clearly prioritizes his cancer control. As such, we have agreed to proceed with a preoperative MRI for planning purposes regarding nerve sparing at the time of prostatectomy. Assuming that this does not show concern for extraprostatic disease, my tentative plan is to perform a bilateral nerve sparing robot-assisted laparoscopic radical prostatectomy and bilateral pelvic lymphadenectomy. We also discussed the implications of a prior laparoscopic hernia repair.   CC: Dr. Wenda Low  Dr. Donald Pore        Next Appointment:      Next Appointment: 08/04/2022 10:00 AM    Appointment Type: 60 Physical Therapy    Location: [facility] [phone]    Provider: Nyra Capes    Reason for Visit:  Next ava PT --preoperative PT prior to radical prostatectomy.      E & M CODES: We spent 48 minutes dedicated to evaluation and management time, including face to face interaction, discussions on coordination of care, documentation, result review, and discussion with others as applicable.     * Signed by Raynelle Bring, M.D. on 07/05/22 at 4:44 PM (EST)*     APPENDED NOTES:    We reviewed his MRI result. There is no evidence to suggest extraprostatic extension. As such, we will plan for a bilateral nerve sparing procedure with the potential for partial nerve sparing on the right side.    * Signed by Raynelle Bring, M.D. on 07/27/22 at 5:31 PM (EST)*

## 2022-08-04 ENCOUNTER — Ambulatory Visit (HOSPITAL_COMMUNITY): Payer: BC Managed Care – PPO | Admitting: Anesthesiology

## 2022-08-04 ENCOUNTER — Observation Stay (HOSPITAL_COMMUNITY)
Admission: RE | Admit: 2022-08-04 | Discharge: 2022-08-05 | Disposition: A | Discharge status: Home / Self Care | Payer: BC Managed Care – PPO | Source: Ambulatory Visit | Attending: Urology | Admitting: Urology

## 2022-08-04 ENCOUNTER — Encounter (HOSPITAL_COMMUNITY): Payer: Self-pay | Admitting: Urology

## 2022-08-04 ENCOUNTER — Other Ambulatory Visit: Payer: Self-pay

## 2022-08-04 ENCOUNTER — Encounter (HOSPITAL_COMMUNITY)
Admission: RE | Disposition: A | Discharge status: Home / Self Care | Payer: Self-pay | Source: Ambulatory Visit | Attending: Urology

## 2022-08-04 DIAGNOSIS — Z79899 Other long term (current) drug therapy: Secondary | ICD-10-CM | POA: Diagnosis not present

## 2022-08-04 DIAGNOSIS — I10 Essential (primary) hypertension: Secondary | ICD-10-CM | POA: Diagnosis not present

## 2022-08-04 DIAGNOSIS — D360 Benign neoplasm of lymph nodes: Secondary | ICD-10-CM | POA: Diagnosis not present

## 2022-08-04 DIAGNOSIS — C61 Malignant neoplasm of prostate: Principal | ICD-10-CM | POA: Diagnosis present

## 2022-08-04 HISTORY — PX: ROBOT ASSISTED LAPAROSCOPIC RADICAL PROSTATECTOMY: SHX5141

## 2022-08-04 HISTORY — PX: LYMPHADENECTOMY: SHX5960

## 2022-08-04 LAB — HEMOGLOBIN AND HEMATOCRIT, BLOOD
HCT: 40.8 % (ref 39.0–52.0)
Hemoglobin: 13.1 g/dL (ref 13.0–17.0)

## 2022-08-04 LAB — ABO/RH: ABO/RH(D): O POS

## 2022-08-04 SURGERY — XI ROBOTIC ASSISTED LAPAROSCOPIC RADICAL PROSTATECTOMY LEVEL 3
Anesthesia: General

## 2022-08-04 MED ORDER — HYOSCYAMINE SULFATE 0.125 MG SL SUBL: 0.1250 mg | SUBLINGUAL_TABLET | Freq: Four times a day (QID) | SUBLINGUAL | Status: DC | PRN

## 2022-08-04 MED ORDER — ONDANSETRON HCL 4 MG/2ML IJ SOLN
INTRAMUSCULAR | Status: DC | PRN
  Administered 2022-08-04: 4 mg via INTRAVENOUS

## 2022-08-04 MED ORDER — LOSARTAN POTASSIUM 50 MG PO TABS
50.0000 mg | ORAL_TABLET | Freq: Every day | ORAL | Status: DC
  Administered 2022-08-04 – 2022-08-05 (×2): 50 mg via ORAL
  Filled 2022-08-04 (×2): qty 1

## 2022-08-04 MED ORDER — ACETAMINOPHEN 10 MG/ML IV SOLN
INTRAVENOUS | Status: AC
  Filled 2022-08-04: qty 100

## 2022-08-04 MED ORDER — LACTATED RINGERS IV SOLN: INTRAVENOUS | Status: DC

## 2022-08-04 MED ORDER — FUROSEMIDE 40 MG PO TABS
40.0000 mg | ORAL_TABLET | Freq: Every day | ORAL | Status: DC
  Administered 2022-08-04 – 2022-08-05 (×2): 40 mg via ORAL
  Filled 2022-08-04 (×2): qty 1

## 2022-08-04 MED ORDER — SUCCINYLCHOLINE CHLORIDE 200 MG/10ML IV SOSY
PREFILLED_SYRINGE | INTRAVENOUS | Status: AC
  Filled 2022-08-04: qty 10

## 2022-08-04 MED ORDER — DOCUSATE SODIUM 100 MG PO CAPS: 100.0000 mg | ORAL_CAPSULE | Freq: Two times a day (BID) | ORAL | Status: DC

## 2022-08-04 MED ORDER — MORPHINE SULFATE (PF) 2 MG/ML IV SOLN
2.0000 mg | INTRAVENOUS | Status: DC | PRN
  Administered 2022-08-04: 2 mg via INTRAVENOUS
  Administered 2022-08-04: 4 mg via INTRAVENOUS
  Filled 2022-08-04: qty 2
  Filled 2022-08-04: qty 1

## 2022-08-04 MED ORDER — BUPIVACAINE-EPINEPHRINE (PF) 0.5% -1:200000 IJ SOLN
INTRAMUSCULAR | Status: DC | PRN
  Administered 2022-08-04: 30 mL

## 2022-08-04 MED ORDER — DEXAMETHASONE SODIUM PHOSPHATE 10 MG/ML IJ SOLN
INTRAMUSCULAR | Status: AC
  Filled 2022-08-04: qty 1

## 2022-08-04 MED ORDER — KCL IN DEXTROSE-NACL 20-5-0.45 MEQ/L-%-% IV SOLN
INTRAVENOUS | Status: DC
  Filled 2022-08-04 (×3): qty 1000

## 2022-08-04 MED ORDER — BUPIVACAINE HCL (PF) 0.25 % IJ SOLN
INTRAMUSCULAR | Status: AC
  Filled 2022-08-04: qty 30

## 2022-08-04 MED ORDER — SULFAMETHOXAZOLE-TRIMETHOPRIM 800-160 MG PO TABS: 1.0000 | ORAL_TABLET | Freq: Two times a day (BID) | ORAL | 0 refills | Status: DC

## 2022-08-04 MED ORDER — LIDOCAINE HCL 2 % IJ SOLN
INTRAMUSCULAR | Status: AC
  Filled 2022-08-04: qty 20

## 2022-08-04 MED ORDER — FENTANYL CITRATE PF 50 MCG/ML IJ SOSY
PREFILLED_SYRINGE | INTRAMUSCULAR | Status: AC
  Filled 2022-08-04: qty 3

## 2022-08-04 MED ORDER — DEXAMETHASONE SODIUM PHOSPHATE 10 MG/ML IJ SOLN
INTRAMUSCULAR | Status: DC | PRN
  Administered 2022-08-04: 10 mg via INTRAVENOUS

## 2022-08-04 MED ORDER — MIDAZOLAM HCL 5 MG/5ML IJ SOLN
INTRAMUSCULAR | Status: DC | PRN
  Administered 2022-08-04: 2 mg via INTRAVENOUS

## 2022-08-04 MED ORDER — ZOLPIDEM TARTRATE 5 MG PO TABS
5.0000 mg | ORAL_TABLET | Freq: Every evening | ORAL | Status: DC | PRN
  Administered 2022-08-04: 5 mg via ORAL
  Filled 2022-08-04: qty 1

## 2022-08-04 MED ORDER — ROSUVASTATIN CALCIUM 20 MG PO TABS
20.0000 mg | ORAL_TABLET | Freq: Every day | ORAL | Status: DC
  Administered 2022-08-04 – 2022-08-05 (×2): 20 mg via ORAL
  Filled 2022-08-04 (×2): qty 1

## 2022-08-04 MED ORDER — MIDAZOLAM HCL 2 MG/2ML IJ SOLN
INTRAMUSCULAR | Status: AC
  Filled 2022-08-04: qty 2

## 2022-08-04 MED ORDER — TRAMADOL HCL 50 MG PO TABS: 50.0000 mg | ORAL_TABLET | Freq: Four times a day (QID) | ORAL | 0 refills | Status: DC | PRN

## 2022-08-04 MED ORDER — STERILE WATER FOR IRRIGATION IR SOLN
Status: DC | PRN
  Administered 2022-08-04: 1000 mL

## 2022-08-04 MED ORDER — ESCITALOPRAM OXALATE 20 MG PO TABS
20.0000 mg | ORAL_TABLET | Freq: Every day | ORAL | Status: DC
  Administered 2022-08-04 – 2022-08-05 (×2): 20 mg via ORAL
  Filled 2022-08-04 (×2): qty 1

## 2022-08-04 MED ORDER — KETAMINE HCL 10 MG/ML IJ SOLN
INTRAMUSCULAR | Status: DC | PRN
  Administered 2022-08-04: 20 mg via INTRAVENOUS
  Administered 2022-08-04: 10 mg via INTRAVENOUS
  Administered 2022-08-04: 20 mg via INTRAVENOUS

## 2022-08-04 MED ORDER — FENTANYL CITRATE PF 50 MCG/ML IJ SOSY
25.0000 ug | PREFILLED_SYRINGE | INTRAMUSCULAR | Status: DC | PRN
  Administered 2022-08-04 (×3): 50 ug via INTRAVENOUS

## 2022-08-04 MED ORDER — ONDANSETRON HCL 4 MG/2ML IJ SOLN: 4.0000 mg | INTRAMUSCULAR | Status: DC | PRN

## 2022-08-04 MED ORDER — DOCUSATE SODIUM 100 MG PO CAPS
100.0000 mg | ORAL_CAPSULE | Freq: Two times a day (BID) | ORAL | Status: DC
  Administered 2022-08-04 – 2022-08-05 (×3): 100 mg via ORAL
  Filled 2022-08-04 (×3): qty 1

## 2022-08-04 MED ORDER — LACTATED RINGERS IV SOLN
INTRAVENOUS | Status: DC | PRN
  Administered 2022-08-04: 1000 mL

## 2022-08-04 MED ORDER — PROPOFOL 10 MG/ML IV BOLUS
INTRAVENOUS | Status: AC
  Filled 2022-08-04: qty 20

## 2022-08-04 MED ORDER — ACETAMINOPHEN 10 MG/ML IV SOLN
INTRAVENOUS | Status: DC | PRN
  Administered 2022-08-04: 1000 mg via INTRAVENOUS

## 2022-08-04 MED ORDER — SODIUM CHLORIDE 0.9 % IR SOLN
Status: DC | PRN
  Administered 2022-08-04: 1 via INTRAVESICAL

## 2022-08-04 MED ORDER — CEFAZOLIN SODIUM-DEXTROSE 2-4 GM/100ML-% IV SOLN
2.0000 g | INTRAVENOUS | Status: AC
  Administered 2022-08-04: 2 g via INTRAVENOUS
  Filled 2022-08-04: qty 100

## 2022-08-04 MED ORDER — EPHEDRINE 5 MG/ML INJ
INTRAVENOUS | Status: AC
  Filled 2022-08-04: qty 5

## 2022-08-04 MED ORDER — FENTANYL CITRATE (PF) 100 MCG/2ML IJ SOLN
INTRAMUSCULAR | Status: DC | PRN
  Administered 2022-08-04 (×2): 50 ug via INTRAVENOUS

## 2022-08-04 MED ORDER — HEPARIN SODIUM (PORCINE) 1000 UNIT/ML IJ SOLN
INTRAMUSCULAR | Status: AC
  Filled 2022-08-04: qty 1

## 2022-08-04 MED ORDER — POTASSIUM CHLORIDE CRYS ER 10 MEQ PO TBCR
10.0000 meq | EXTENDED_RELEASE_TABLET | Freq: Every day | ORAL | Status: DC
  Administered 2022-08-04: 10 meq via ORAL
  Filled 2022-08-04: qty 1

## 2022-08-04 MED ORDER — LIDOCAINE 2% (20 MG/ML) 5 ML SYRINGE
INTRAMUSCULAR | Status: DC | PRN
  Administered 2022-08-04: 80 mg via INTRAVENOUS

## 2022-08-04 MED ORDER — TRIPLE ANTIBIOTIC 3.5-400-5000 EX OINT: 1.0000 | TOPICAL_OINTMENT | Freq: Three times a day (TID) | CUTANEOUS | Status: DC | PRN

## 2022-08-04 MED ORDER — ONDANSETRON HCL 4 MG/2ML IJ SOLN
INTRAMUSCULAR | Status: AC
  Filled 2022-08-04: qty 2

## 2022-08-04 MED ORDER — OXYCODONE HCL 5 MG/5ML PO SOLN: 5.0000 mg | Freq: Once | ORAL | Status: DC | PRN

## 2022-08-04 MED ORDER — BUPIVACAINE-EPINEPHRINE (PF) 0.5% -1:200000 IJ SOLN
INTRAMUSCULAR | Status: AC
  Filled 2022-08-04: qty 30

## 2022-08-04 MED ORDER — ROCURONIUM BROMIDE 10 MG/ML (PF) SYRINGE
PREFILLED_SYRINGE | INTRAVENOUS | Status: AC
  Filled 2022-08-04: qty 10

## 2022-08-04 MED ORDER — FLEET ENEMA 7-19 GM/118ML RE ENEM: 1.0000 | ENEMA | Freq: Once | RECTAL | Status: DC

## 2022-08-04 MED ORDER — FLUTICASONE PROPIONATE 50 MCG/ACT NA SUSP: 1.0000 | Freq: Every day | NASAL | Status: DC | PRN

## 2022-08-04 MED ORDER — LORATADINE 10 MG PO TABS: 10.0000 mg | ORAL_TABLET | Freq: Every day | ORAL | Status: DC | PRN

## 2022-08-04 MED ORDER — ACETAMINOPHEN 325 MG PO TABS: 650.0000 mg | ORAL_TABLET | ORAL | Status: DC | PRN

## 2022-08-04 MED ORDER — LIDOCAINE 20MG/ML (2%) 15 ML SYRINGE OPTIME
INTRAMUSCULAR | Status: DC | PRN
  Administered 2022-08-04: 1.5 mg/kg/h via INTRAVENOUS

## 2022-08-04 MED ORDER — ORAL CARE MOUTH RINSE: 15.0000 mL | Freq: Once | OROMUCOSAL | Status: AC

## 2022-08-04 MED ORDER — ROCURONIUM BROMIDE 10 MG/ML (PF) SYRINGE
PREFILLED_SYRINGE | INTRAVENOUS | Status: DC | PRN
  Administered 2022-08-04: 70 mg via INTRAVENOUS
  Administered 2022-08-04 (×2): 10 mg via INTRAVENOUS

## 2022-08-04 MED ORDER — ONDANSETRON HCL 4 MG/2ML IJ SOLN: 4.0000 mg | Freq: Once | INTRAMUSCULAR | Status: DC | PRN

## 2022-08-04 MED ORDER — AMISULPRIDE (ANTIEMETIC) 5 MG/2ML IV SOLN: 10.0000 mg | Freq: Once | INTRAVENOUS | Status: DC | PRN

## 2022-08-04 MED ORDER — CEFAZOLIN SODIUM-DEXTROSE 1-4 GM/50ML-% IV SOLN
1.0000 g | Freq: Three times a day (TID) | INTRAVENOUS | Status: AC
  Administered 2022-08-04 (×2): 1 g via INTRAVENOUS
  Filled 2022-08-04 (×2): qty 50

## 2022-08-04 MED ORDER — MAGNESIUM CITRATE PO SOLN
1.0000 | Freq: Once | ORAL | Status: DC
  Filled 2022-08-04: qty 296

## 2022-08-04 MED ORDER — SODIUM CHLORIDE 0.9 % IV BOLUS
1000.0000 mL | Freq: Once | INTRAVENOUS | Status: AC
  Administered 2022-08-04: 1000 mL via INTRAVENOUS

## 2022-08-04 MED ORDER — CHLORHEXIDINE GLUCONATE 0.12 % MT SOLN
15.0000 mL | Freq: Once | OROMUCOSAL | Status: AC
  Administered 2022-08-04: 15 mL via OROMUCOSAL

## 2022-08-04 MED ORDER — LIDOCAINE HCL (PF) 2 % IJ SOLN
INTRAMUSCULAR | Status: AC
  Filled 2022-08-04: qty 5

## 2022-08-04 MED ORDER — KETOROLAC TROMETHAMINE 15 MG/ML IJ SOLN
15.0000 mg | Freq: Four times a day (QID) | INTRAMUSCULAR | Status: DC
  Administered 2022-08-04 – 2022-08-05 (×5): 15 mg via INTRAVENOUS
  Filled 2022-08-04 (×5): qty 1

## 2022-08-04 MED ORDER — FENTANYL CITRATE (PF) 100 MCG/2ML IJ SOLN
INTRAMUSCULAR | Status: AC
  Filled 2022-08-04: qty 2

## 2022-08-04 MED ORDER — OXYCODONE HCL 5 MG PO TABS: 5.0000 mg | ORAL_TABLET | Freq: Once | ORAL | Status: DC | PRN

## 2022-08-04 MED ORDER — DIPHENHYDRAMINE HCL 50 MG/ML IJ SOLN: 12.5000 mg | Freq: Four times a day (QID) | INTRAMUSCULAR | Status: DC | PRN

## 2022-08-04 MED ORDER — SUGAMMADEX SODIUM 200 MG/2ML IV SOLN
INTRAVENOUS | Status: DC | PRN
  Administered 2022-08-04: 200 mg via INTRAVENOUS

## 2022-08-04 MED ORDER — DIPHENHYDRAMINE HCL 12.5 MG/5ML PO ELIX: 12.5000 mg | ORAL_SOLUTION | Freq: Four times a day (QID) | ORAL | Status: DC | PRN

## 2022-08-04 MED ORDER — KETAMINE HCL 10 MG/ML IJ SOLN
INTRAMUSCULAR | Status: AC
  Filled 2022-08-04: qty 1

## 2022-08-04 MED ORDER — EPHEDRINE SULFATE-NACL 50-0.9 MG/10ML-% IV SOSY
PREFILLED_SYRINGE | INTRAVENOUS | Status: DC | PRN
  Administered 2022-08-04 (×4): 5 mg via INTRAVENOUS

## 2022-08-04 MED ORDER — PROPOFOL 10 MG/ML IV BOLUS
INTRAVENOUS | Status: DC | PRN
  Administered 2022-08-04: 140 mg via INTRAVENOUS

## 2022-08-04 SURGICAL SUPPLY — 68 items
ADH SKN CLS APL DERMABOND .7 (GAUZE/BANDAGES/DRESSINGS) ×2
ADH SKN CLS LQ APL DERMABOND (GAUZE/BANDAGES/DRESSINGS) ×2
APL PRP STRL LF DISP 70% ISPRP (MISCELLANEOUS) ×2
APL SWBSTK 6 STRL LF DISP (MISCELLANEOUS) ×2
APPLICATOR COTTON TIP 6 STRL (MISCELLANEOUS) ×3 IMPLANT
APPLICATOR COTTON TIP 6IN STRL (MISCELLANEOUS) ×2
BAG COUNTER SPONGE SURGICOUNT (BAG) IMPLANT
BAG SPNG CNTER NS LX DISP (BAG)
CATH FOLEY 2WAY SLVR 18FR 30CC (CATHETERS) ×2 IMPLANT
CATH ROBINSON RED A/P 16FR (CATHETERS) ×3 IMPLANT
CATH ROBINSON RED A/P 8FR (CATHETERS) ×3 IMPLANT
CATH TIEMANN FOLEY 18FR 5CC (CATHETERS) ×3 IMPLANT
CHLORAPREP W/TINT 26 (MISCELLANEOUS) ×2 IMPLANT
CLIP LIGATING HEM O LOK PURPLE (MISCELLANEOUS) ×3 IMPLANT
COVER SURGICAL LIGHT HANDLE (MISCELLANEOUS) ×2 IMPLANT
COVER TIP SHEARS 8 DVNC (MISCELLANEOUS) ×3 IMPLANT
COVER TIP SHEARS 8MM DA VINCI (MISCELLANEOUS) ×2
CUTTER ECHEON FLEX ENDO 45 340 (ENDOMECHANICALS) ×2 IMPLANT
DERMABOND ADVANCED .7 DNX12 (GAUZE/BANDAGES/DRESSINGS) ×3 IMPLANT
DERMABOND ADVANCED .7 DNX6 (GAUZE/BANDAGES/DRESSINGS) IMPLANT
DRAIN CHANNEL RND F F (WOUND CARE) IMPLANT
DRAPE ARM DVNC X/XI (DISPOSABLE) ×8 IMPLANT
DRAPE COLUMN DVNC XI (DISPOSABLE) ×2 IMPLANT
DRAPE DA VINCI XI ARM (DISPOSABLE) ×8
DRAPE DA VINCI XI COLUMN (DISPOSABLE) ×2
DRAPE SURG IRRIG POUCH 19X23 (DRAPES) ×3 IMPLANT
DRSG TEGADERM 4X4.75 (GAUZE/BANDAGES/DRESSINGS) ×3 IMPLANT
ELECT PENCIL ROCKER SW 15FT (MISCELLANEOUS) ×2 IMPLANT
ELECT REM PT RETURN 15FT ADLT (MISCELLANEOUS) ×2 IMPLANT
GAUZE 4X4 16PLY ~~LOC~~+RFID DBL (SPONGE) ×2 IMPLANT
GAUZE SPONGE 4X4 12PLY STRL (GAUZE/BANDAGES/DRESSINGS) ×3 IMPLANT
GLOVE BIO SURGEON STRL SZ 6.5 (GLOVE) ×2 IMPLANT
GLOVE SURG LX STRL 7.5 STRW (GLOVE) ×4 IMPLANT
GOWN SRG XL LVL 4 BRTHBL STRL (GOWNS) ×2 IMPLANT
GOWN STRL NON-REIN XL LVL4 (GOWNS) ×2
GOWN STRL REUS W/ TWL XL LVL3 (GOWN DISPOSABLE) ×6 IMPLANT
GOWN STRL REUS W/TWL XL LVL3 (GOWN DISPOSABLE) ×4
HOLDER FOLEY CATH W/STRAP (MISCELLANEOUS) ×3 IMPLANT
IRRIG SUCT STRYKERFLOW 2 WTIP (MISCELLANEOUS) ×2
IRRIGATION SUCT STRKRFLW 2 WTP (MISCELLANEOUS) ×3 IMPLANT
IV LACTATED RINGERS 1000ML (IV SOLUTION) ×3 IMPLANT
KIT TURNOVER KIT A (KITS) IMPLANT
NDL SAFETY ECLIP 18X1.5 (MISCELLANEOUS) ×3 IMPLANT
PACK ROBOT UROLOGY CUSTOM (CUSTOM PROCEDURE TRAY) ×2 IMPLANT
RELOAD STAPLE 45 4.1 GRN THCK (STAPLE) ×2 IMPLANT
SEAL CANN UNIV 5-8 DVNC XI (MISCELLANEOUS) ×8 IMPLANT
SEAL XI 5MM-8MM UNIVERSAL (MISCELLANEOUS) ×8
SET TUBE SMOKE EVAC HIGH FLOW (TUBING) ×3 IMPLANT
SOLUTION ELECTROLUBE (MISCELLANEOUS) ×3 IMPLANT
SPIKE FLUID TRANSFER (MISCELLANEOUS) ×2 IMPLANT
STAPLE RELOAD 45 GRN (STAPLE) ×2 IMPLANT
STAPLE RELOAD 45MM GREEN (STAPLE) ×2
SUT ETHILON 3 0 PS 1 (SUTURE) ×2 IMPLANT
SUT MNCRL 3 0 RB1 (SUTURE) ×3 IMPLANT
SUT MNCRL 3 0 VIOLET RB1 (SUTURE) ×2 IMPLANT
SUT MNCRL AB 4-0 PS2 18 (SUTURE) ×4 IMPLANT
SUT MONOCRYL 3 0 RB1 (SUTURE) ×4
SUT PDS PLUS 0 (SUTURE) ×4
SUT PDS PLUS AB 0 CT-2 (SUTURE) ×4 IMPLANT
SUT VIC AB 0 CT1 27 (SUTURE) ×4
SUT VIC AB 0 CT1 27XBRD ANTBC (SUTURE) ×6 IMPLANT
SUT VIC AB 2-0 SH 27 (SUTURE) ×2
SUT VIC AB 2-0 SH 27X BRD (SUTURE) ×2 IMPLANT
SYR 27GX1/2 1ML LL SAFETY (SYRINGE) ×2 IMPLANT
TOWEL OR NON WOVEN STRL DISP B (DISPOSABLE) ×2 IMPLANT
TROCAR Z THREAD OPTICAL 12X100 (TROCAR) IMPLANT
TROCAR Z-THREAD FIOS 5X100MM (TROCAR) IMPLANT
WATER STERILE IRR 1000ML POUR (IV SOLUTION) ×2 IMPLANT

## 2022-08-04 NOTE — Progress Notes (Signed)
Patient ID: Brent Massey, male   DOB: 1956-04-23, 66 y.o.   MRN: [id number]  Post-op note  Subjective: The patient is doing well.  No complaints.  Objective: Vital signs in last 24 hours: Temp:  [97.4 F (36.3 C)-98.5 F (36.9 C)] 97.7 F (36.5 C) (12/14 1245) Pulse Rate:  [73-92] 79 (12/14 1245) Resp:  [12-22] 17 (12/14 1230) BP: (127-156)/(67-89) 156/86 (12/14 1245) SpO2:  [98 %-100 %] 100 % (12/14 1245) Weight:  [97.1 kg] 97.1 kg (12/14 0642)  Intake/Output from previous day: No intake/output data recorded. Intake/Output this shift: Total I/O In: 3305.9 [P.O.:120; I.V.:2135.9; IV Piggyback:1050] Out: 9935 [Urine:1425; Drains:205; Blood:100]  Physical Exam:  General: Alert and oriented. Abdomen: Soft, Nondistended. Incisions: Clean and dry. GU: Urine clear.  Lab Results: Recent Labs    08/04/22 1152  HGB 13.1  HCT 40.8    Assessment/Plan: POD#0   1) Continue to monitor, ambulate, IS   Pryor Curia. MD   LOS: 0 days   [name] 08/04/2022, 5:38 PM

## 2022-08-04 NOTE — Transfer of Care (Signed)
Immediate Anesthesia Transfer of Care Note  Patient: Brent Massey  Procedure(s) Performed: XI ROBOTIC ASSISTED LAPAROSCOPIC RADICAL PROSTATECTOMY LEVEL 3 BILATERAL PELVIC LYMPHADENECTOMY (Bilateral)  Patient Location: PACU  Anesthesia Type:General  Level of Consciousness: awake, alert , and oriented  Airway & Oxygen Therapy: Patient Spontanous Breathing and Patient connected to face mask oxygen  Post-op Assessment: Report given to RN and Post -op Vital signs reviewed and stable  Post vital signs: Reviewed and stable  Last Vitals:  Vitals Value Taken Time  BP 135/89 08/04/22 1100  Temp    Pulse 90 08/04/22 1101  Resp 21 08/04/22 1101  SpO2 100 % 08/04/22 1101  Vitals shown include unvalidated device data.  Last Pain:  Vitals:   08/04/22 0642  TempSrc:   PainSc: 0-No pain         Complications: No notable events documented.

## 2022-08-04 NOTE — Anesthesia Postprocedure Evaluation (Signed)
Anesthesia Post Note  Patient: Brent Massey  Procedure(s) Performed: XI ROBOTIC ASSISTED LAPAROSCOPIC RADICAL PROSTATECTOMY LEVEL 3 BILATERAL PELVIC LYMPHADENECTOMY (Bilateral)     Patient location during evaluation: PACU Anesthesia Type: General Level of consciousness: awake and alert Pain management: pain level controlled Vital Signs Assessment: post-procedure vital signs reviewed and stable Respiratory status: spontaneous breathing, nonlabored ventilation and respiratory function stable Cardiovascular status: blood pressure returned to baseline and stable Postop Assessment: no apparent nausea or vomiting Anesthetic complications: no   No notable events documented.  Last Vitals:  Vitals:   08/04/22 1230 08/04/22 1245  BP: (!) 142/75 (!) 156/86  Pulse: 73 79  Resp: 17   Temp: (!) 36.3 C 36.5 C  SpO2: 100% 100%    Last Pain:  Vitals:   08/04/22 1400  TempSrc:   PainSc: [place]

## 2022-08-04 NOTE — Interval H&P Note (Signed)
History and Physical Interval Note:  08/04/2022 6:43 AM  Brent Massey  has presented today for surgery, with the diagnosis of PROSTATE CANCER.  The various methods of treatment have been discussed with the patient and family. After consideration of risks, benefits and other options for treatment, the patient has consented to  Procedure(s) with comments: XI ROBOTIC ASSISTED LAPAROSCOPIC RADICAL PROSTATECTOMY LEVEL 3 (N/A) - 210 MINUTES NEEDED FOR CASE BILATERAL PELVIC LYMPHADENECTOMY (Bilateral) as a surgical intervention.  The patient's history has been reviewed, patient examined, no change in status, stable for surgery.  I have reviewed the patient's chart and labs.  Questions were answered to the patient's satisfaction.     [employer]

## 2022-08-04 NOTE — Op Note (Signed)
Preoperative diagnosis: Clinically localized adenocarcinoma of the prostate (clinical stage T1c N0 M0)  Postoperative diagnosis: Clinically localized adenocarcinoma of the prostate (clinical stage T1c N0 M0)  Procedure:  Robotic assisted laparoscopic radical prostatectomy (bilateral nerve sparing) Bilateral robotic assisted laparoscopic pelvic lymphadenectomy  Surgeon: Pryor Curia. M.D.  Assistant: Debbrah Alar, PA-C  An assistant was required for this surgical procedure.  The duties of the assistant included but were not limited to suctioning, passing suture, camera manipulation, retraction. This procedure would not be able to be performed without an Environmental consultant.  Resident: Dr. Margarita Sermons  Anesthesia: General  Complications: None  EBL: 100 mL  IVF:  1500 mL crystalloid  Specimens: Prostate and seminal vesicles Right pelvic lymph nodes Left pelvic lymph nodes  Disposition of specimens: Pathology  Drains: 20 Fr coude catheter # 19 Blake pelvic drain  Indication: Brent Massey is a 66 y.o. year old patient with clinically localized prostate cancer.  After a thorough review of the management options for treatment of prostate cancer, he elected to proceed with surgical therapy and the above procedure(s).  We have discussed the potential benefits and risks of the procedure, side effects of the proposed treatment, the likelihood of the patient achieving the goals of the procedure, and any potential problems that might occur during the procedure or recuperation. Informed consent has been obtained.  Description of procedure:  The patient was taken to the operating room and a general anesthetic was administered. He was given preoperative antibiotics, placed in the dorsal lithotomy position, and prepped and draped in the usual sterile fashion. Next a preoperative timeout was performed. A urethral catheter was placed into the bladder and a site was selected near the umbilicus  for placement of the camera port. This was placed using a standard open Hassan technique which allowed entry into the peritoneal cavity under direct vision and without difficulty. An 8 mm robotic port was placed and a pneumoperitoneum established. The camera was then used to inspect the abdomen and there was no evidence of any intra-abdominal injuries or other abnormalities. The remaining abdominal ports were then placed. 8 mm robotic ports were placed in the right lower quadrant, left lower quadrant, and far left lateral abdominal wall. A 5 mm port was placed in the right upper quadrant and a 12 mm port was placed in the right lateral abdominal wall for laparoscopic assistance. All ports were placed under direct vision without difficulty. The surgical cart was then docked.   Utilizing the cautery scissors, the bladder was reflected posteriorly allowing entry into the space of Retzius and identification of the endopelvic fascia and prostate. The periprostatic fat was then removed from the prostate allowing full exposure of the endopelvic fascia. The endopelvic fascia was then incised from the apex back to the base of the prostate bilaterally and the underlying levator muscle fibers were swept laterally off the prostate thereby isolating the dorsal venous complex. The dorsal vein was then stapled and divided with a 45 mm Flex Echelon stapler. Attention then turned to the bladder neck which was divided anteriorly thereby allowing entry into the bladder and exposure of the urethral catheter. The catheter balloon was deflated and the catheter was brought into the operative field and used to retract the prostate anteriorly. The posterior bladder neck was then examined and was divided allowing further dissection between the bladder and prostate posteriorly until the vasa deferentia and seminal vessels were identified. The vasa deferentia were isolated, divided, and lifted anteriorly. The seminal vesicles  were dissected  down to their tips with care to control the seminal vascular arterial blood supply. These structures were then lifted anteriorly and the space between Denonvillier's fascia and the anterior rectum was developed with a combination of sharp and blunt dissection. This isolated the vascular pedicles of the prostate.  The lateral prostatic fascia was then sharply incised allowing release of the neurovascular bundles bilaterally. The vascular pedicles of the prostate were then ligated with Weck clips between the prostate and neurovascular bundles and divided with sharp cold scissor dissection resulting in neurovascular bundle preservation. The neurovascular bundles were then separated off the apex of the prostate and urethra bilaterally.  The urethra was then sharply transected allowing the prostate specimen to be disarticulated. The pelvis was copiously irrigated and hemostasis was ensured. There was no evidence for rectal injury.  Attention then turned to the right pelvic sidewall. The fibrofatty tissue between the external iliac vein, confluence of the iliac vessels, hypogastric artery, and Cooper's ligament was dissected free from the pelvic sidewall with care to preserve the obturator nerve. Weck clips were used for lymphostasis and hemostasis. An identical procedure was performed on the contralateral side and the lymphatic packets were removed for permanent pathologic analysis.  Attention then turned to the urethral anastomosis. A 2-0 Vicryl slip knot was placed between Denonvillier's fascia, the posterior bladder neck, and the posterior urethra to reapproximate these structures. A double-armed 3-0 Monocryl suture was then used to perform a 360 running tension-free anastomosis between the bladder neck and urethra. A new urethral catheter was then placed into the bladder and irrigated. There were no blood clots within the bladder and the anastomosis appeared to be watertight. A #19 Blake drain was then  brought through the left lateral 8 mm port site and positioned appropriately within the pelvis. It was secured to the skin with a nylon suture. The surgical cart was then undocked. The right lateral 12 mm port site was closed at the fascial level with a 0 Vicryl suture placed laparoscopically. All remaining ports were then removed under direct vision. The prostate specimen was removed intact within the Endopouch retrieval bag via the periumbilical camera port site. This fascial opening was closed with two running 0 PDS sutures. 0.25% Marcaine was then injected into all port sites and all incisions were reapproximated at the skin level with 4-0 Monocryl subcuticular sutures and Dermabond. The patient appeared to tolerate the procedure well and without complications. The patient was able to be extubated and transferred to the recovery unit in satisfactory condition.   Pryor Curia MD

## 2022-08-04 NOTE — Anesthesia Procedure Notes (Signed)
Procedure Name: Intubation Date/Time: 08/04/2022 7:32 AM  Performed by: Mohmmad Saleeby D, CRNAPre-anesthesia Checklist: Patient identified, Emergency Drugs available, Suction available and Patient being monitored Patient Re-evaluated:Patient Re-evaluated prior to induction Oxygen Delivery Method: Circle system utilized Preoxygenation: Pre-oxygenation with 100% oxygen Induction Type: IV induction Ventilation: Mask ventilation without difficulty Laryngoscope Size: Mac and 4 Grade View: Grade I Tube type: Oral Tube size: 7.5 mm Number of attempts: 1 Airway Equipment and Method: Stylet and Oral airway Placement Confirmation: ETT inserted through vocal cords under direct vision, positive ETCO2 and breath sounds checked- equal and bilateral Secured at: 23 cm Tube secured with: Tape Dental Injury: Teeth and Oropharynx as per pre-operative assessment

## 2022-08-04 NOTE — Discharge Instructions (Signed)

## 2022-08-05 ENCOUNTER — Other Ambulatory Visit: Payer: Self-pay

## 2022-08-05 ENCOUNTER — Encounter (HOSPITAL_COMMUNITY): Payer: Self-pay | Admitting: Urology

## 2022-08-05 DIAGNOSIS — I10 Essential (primary) hypertension: Secondary | ICD-10-CM | POA: Diagnosis not present

## 2022-08-05 DIAGNOSIS — C61 Malignant neoplasm of prostate: Secondary | ICD-10-CM | POA: Diagnosis not present

## 2022-08-05 DIAGNOSIS — Z79899 Other long term (current) drug therapy: Secondary | ICD-10-CM | POA: Diagnosis not present

## 2022-08-05 DIAGNOSIS — D360 Benign neoplasm of lymph nodes: Secondary | ICD-10-CM | POA: Diagnosis not present

## 2022-08-05 LAB — HEMOGLOBIN AND HEMATOCRIT, BLOOD
HCT: 40.2 % (ref 39.0–52.0)
Hemoglobin: 12.9 g/dL — ABNORMAL LOW (ref 13.0–17.0)

## 2022-08-05 MED ORDER — TRAMADOL HCL 50 MG PO TABS: 50.0000 mg | ORAL_TABLET | Freq: Four times a day (QID) | ORAL | Status: DC | PRN

## 2022-08-05 MED ORDER — BISACODYL 10 MG RE SUPP
10.0000 mg | Freq: Once | RECTAL | Status: AC
  Administered 2022-08-05: 10 mg via RECTAL
  Filled 2022-08-05: qty 1

## 2022-08-05 NOTE — Progress Notes (Signed)
  Transition of Care [facility]) Screening Note   Patient Details  Name: Eion Timbrook Date of Birth: 1955/10/03   [facility]) CM/SW Contact:    Henrietta Dine, RN Phone Number: [phone] 08/05/2022, 11:33 AM    Transition of Care Department [facility]) has reviewed patient and no TOC needs have been identified at this time. We will continue to monitor patient advancement through interdisciplinary progression rounds. If new patient transition needs arise, please place a TOC consult.

## 2022-08-05 NOTE — Plan of Care (Signed)
  Problem: Urinary Elimination: Goal: Ability to avoid or minimize complications of infection will improve Outcome: Progressing   Problem: Urinary Elimination: Goal: Ability to achieve and maintain urine output will improve Outcome: Progressing   Problem: Clinical Measurements: Goal: Will remain free from infection Outcome: Progressing   Problem: Clinical Measurements: Goal: Diagnostic test results will improve Outcome: Progressing   Problem: Activity: Goal: Risk for activity intolerance will decrease Outcome: Progressing   Problem: Nutrition: Goal: Adequate nutrition will be maintained Outcome: Progressing   Problem: Coping: Goal: Level of anxiety will decrease Outcome: Progressing

## 2022-08-05 NOTE — Discharge Summary (Signed)
  Date of admission: 08/04/2022  Date of discharge: 08/05/2022  Admission diagnosis: Prostate Cancer  Discharge diagnosis: Prostate Cancer  History and Physical: For full details, please see admission history and physical. Briefly, Brent Massey is a 66 y.o. gentleman with localized prostate cancer.  After discussing management/treatment options, he elected to proceed with surgical treatment.  Hospital Course: Brent Massey was taken to the operating room on 08/04/2022 and underwent a robotic assisted laparoscopic radical prostatectomy. He tolerated this procedure well and without complications. Postoperatively, he was able to be transferred to a regular hospital room following recovery from anesthesia.  He was able to begin ambulating the night of surgery. He remained hemodynamically stable overnight.  He had excellent urine output with appropriately minimal output from his pelvic drain and his pelvic drain was removed on POD #1.  He was transitioned to oral pain medication, tolerated a clear liquid diet, and had met all discharge criteria and was able to be discharged home later on POD#1.  Laboratory values:  Recent Labs    08/04/22 1152 08/05/22 0404  HGB 13.1 12.9*  HCT 40.8 40.2    Disposition: Home  Discharge instruction: He was instructed to be ambulatory but to refrain from heavy lifting, strenuous activity, or driving. He was instructed on urethral catheter care.  Discharge medications:   Allergies as of 08/05/2022       Reactions   Lyrica [pregabalin] Swelling   Legs/ankle/feet swelling   Neurontin [gabapentin] Swelling   Legs/ankle/feet swelling        Medication List     STOP taking these medications    multivitamin with minerals Tabs tablet   Vitamin D3 50 MCG (2000 UT) Tabs       TAKE these medications    docusate sodium 100 MG capsule Commonly known as: COLACE Take 1 capsule (100 mg total) by mouth 2 (two) times daily.   escitalopram 20 MG  tablet Commonly known as: LEXAPRO Take 20 mg by mouth daily.   furosemide 40 MG tablet Commonly known as: LASIX Take 40 mg by mouth daily.   loratadine 10 MG tablet Commonly known as: CLARITIN Take 10 mg by mouth daily as needed for allergies.   losartan 50 MG tablet Commonly known as: COZAAR Take 50 mg by mouth daily.   Nasonex 50 MCG/ACT nasal spray Generic drug: mometasone Place 2 sprays into the nose daily as needed (allergies).   potassium chloride 10 MEQ tablet Commonly known as: KLOR-CON M Take 10 mEq by mouth daily.   rosuvastatin 20 MG tablet Commonly known as: CRESTOR TAKE 1 TABLET(20 MG) BY MOUTH DAILY   sulfamethoxazole-trimethoprim 800-160 MG tablet Commonly known as: BACTRIM DS Take 1 tablet by mouth 2 (two) times daily. Start the day prior to foley removal appointment   SYSTANE OP Place 1 drop into both eyes as needed (dry eyes).   tiZANidine 4 MG tablet Commonly known as: Zanaflex Take 1 tablet (4 mg total) by mouth every 6 (six) hours as needed for muscle spasms.   traMADol 50 MG tablet Commonly known as: Ultram Take 1-2 tablets (50-100 mg total) by mouth every 6 (six) hours as needed for moderate pain or severe pain.        Followup: He will followup in 1 week for catheter removal and to discuss his surgical pathology results.

## 2022-08-05 NOTE — Progress Notes (Signed)
Patient ID: Brent Massey, male   DOB: 04-05-1956, 66 y.o.   MRN: [id number]  1 Day Post-Op Subjective: The patient is doing well.  No nausea or vomiting. Pain is adequately controlled.  Objective: Vital signs in last 24 hours: Temp:  [97.4 F (36.3 C)-98.9 F (37.2 C)] 98.9 F (37.2 C) (12/15 0502) Pulse Rate:  [62-92] 63 (12/15 0502) Resp:  [12-22] 12 (12/15 0502) BP: (122-156)/(66-89) 122/75 (12/15 0502) SpO2:  [98 %-100 %] 100 % (12/15 0502)  Intake/Output from previous day: 12/14 0701 - 12/15 0700 In: 5542.7 [P.O.:240; I.V.:4202.7; IV Piggyback:1100] Out: 4166 [Urine:3125; Drains:400; Blood:100] Intake/Output this shift: No intake/output data recorded.  Physical Exam:  General: Alert and oriented. CV: RRR Lungs: Clear bilaterally. GI: Soft, Nondistended. Incisions: Clean, dry, and intact Urine: Clear Extremities: Nontender, no erythema, no edema.  Lab Results: Recent Labs    08/04/22 1152 08/05/22 0404  HGB 13.1 12.9*  HCT 40.8 40.2      Assessment/Plan: POD# 1 s/p robotic prostatectomy.  1) SL IVF 2) Ambulate, Incentive spirometry 3) Transition to oral pain medication 4) Dulcolax suppository 5) D/C pelvic drain 6) Plan for likely discharge later today   Brent Massey. Brent Massey   LOS: 0 days   Brent Massey 08/05/2022, 7:37 AM

## 2022-08-09 LAB — SURGICAL PATHOLOGY

## 2022-09-01 DIAGNOSIS — M62838 Other muscle spasm: Secondary | ICD-10-CM | POA: Diagnosis not present

## 2022-09-01 DIAGNOSIS — N393 Stress incontinence (female) (male): Secondary | ICD-10-CM | POA: Diagnosis not present

## 2022-09-01 DIAGNOSIS — M6281 Muscle weakness (generalized): Secondary | ICD-10-CM | POA: Diagnosis not present

## 2022-09-16 DIAGNOSIS — M6281 Muscle weakness (generalized): Secondary | ICD-10-CM | POA: Diagnosis not present

## 2022-09-16 DIAGNOSIS — N393 Stress incontinence (female) (male): Secondary | ICD-10-CM | POA: Diagnosis not present

## 2022-09-16 DIAGNOSIS — M62838 Other muscle spasm: Secondary | ICD-10-CM | POA: Diagnosis not present

## 2022-09-27 DIAGNOSIS — D72819 Decreased white blood cell count, unspecified: Secondary | ICD-10-CM | POA: Diagnosis not present

## 2022-09-27 DIAGNOSIS — F330 Major depressive disorder, recurrent, mild: Secondary | ICD-10-CM | POA: Diagnosis not present

## 2022-09-27 DIAGNOSIS — I10 Essential (primary) hypertension: Secondary | ICD-10-CM | POA: Diagnosis not present

## 2022-09-27 DIAGNOSIS — I872 Venous insufficiency (chronic) (peripheral): Secondary | ICD-10-CM | POA: Diagnosis not present

## 2022-09-27 DIAGNOSIS — E785 Hyperlipidemia, unspecified: Secondary | ICD-10-CM | POA: Diagnosis not present

## 2022-10-06 DIAGNOSIS — M6281 Muscle weakness (generalized): Secondary | ICD-10-CM | POA: Diagnosis not present

## 2022-10-06 DIAGNOSIS — M62838 Other muscle spasm: Secondary | ICD-10-CM | POA: Diagnosis not present

## 2022-10-06 DIAGNOSIS — N393 Stress incontinence (female) (male): Secondary | ICD-10-CM | POA: Diagnosis not present

## 2022-11-02 DIAGNOSIS — M6281 Muscle weakness (generalized): Secondary | ICD-10-CM | POA: Diagnosis not present

## 2022-11-02 DIAGNOSIS — C61 Malignant neoplasm of prostate: Secondary | ICD-10-CM | POA: Diagnosis not present

## 2022-11-02 DIAGNOSIS — M62838 Other muscle spasm: Secondary | ICD-10-CM | POA: Diagnosis not present

## 2022-11-02 DIAGNOSIS — N393 Stress incontinence (female) (male): Secondary | ICD-10-CM | POA: Diagnosis not present

## 2022-11-09 DIAGNOSIS — N5201 Erectile dysfunction due to arterial insufficiency: Secondary | ICD-10-CM | POA: Diagnosis not present

## 2022-11-09 DIAGNOSIS — C61 Malignant neoplasm of prostate: Secondary | ICD-10-CM | POA: Diagnosis not present

## 2022-11-09 DIAGNOSIS — N393 Stress incontinence (female) (male): Secondary | ICD-10-CM | POA: Diagnosis not present

## 2022-12-07 DIAGNOSIS — M62838 Other muscle spasm: Secondary | ICD-10-CM | POA: Diagnosis not present

## 2022-12-07 DIAGNOSIS — M6281 Muscle weakness (generalized): Secondary | ICD-10-CM | POA: Diagnosis not present

## 2022-12-07 DIAGNOSIS — N393 Stress incontinence (female) (male): Secondary | ICD-10-CM | POA: Diagnosis not present

## 2023-01-09 ENCOUNTER — Other Ambulatory Visit (HOSPITAL_COMMUNITY): Payer: Self-pay

## 2023-01-09 MED ORDER — LOSARTAN POTASSIUM 50 MG PO TABS
50.0000 mg | ORAL_TABLET | Freq: Every day | ORAL | 1 refills | Status: DC
  Filled 2023-01-09: qty 90, 90d supply, fill #0
  Filled 2023-04-07: qty 60, 60d supply, fill #1

## 2023-01-10 ENCOUNTER — Other Ambulatory Visit (HOSPITAL_COMMUNITY): Payer: Self-pay

## 2023-01-26 ENCOUNTER — Other Ambulatory Visit (HOSPITAL_COMMUNITY): Payer: Self-pay

## 2023-01-26 MED ORDER — ESCITALOPRAM OXALATE 20 MG PO TABS
20.0000 mg | ORAL_TABLET | Freq: Every day | ORAL | 1 refills | Status: DC
  Filled 2023-01-26: qty 90, 90d supply, fill #0
  Filled 2023-02-07: qty 60, 60d supply, fill #1

## 2023-01-26 MED ORDER — FUROSEMIDE 40 MG PO TABS
40.0000 mg | ORAL_TABLET | Freq: Every day | ORAL | 0 refills | Status: DC
  Filled 2023-01-26: qty 90, 90d supply, fill #0

## 2023-02-03 DIAGNOSIS — M62838 Other muscle spasm: Secondary | ICD-10-CM | POA: Diagnosis not present

## 2023-02-03 DIAGNOSIS — N393 Stress incontinence (female) (male): Secondary | ICD-10-CM | POA: Diagnosis not present

## 2023-02-03 DIAGNOSIS — M6281 Muscle weakness (generalized): Secondary | ICD-10-CM | POA: Diagnosis not present

## 2023-02-07 ENCOUNTER — Other Ambulatory Visit (HOSPITAL_COMMUNITY): Payer: Self-pay

## 2023-02-07 MED ORDER — POTASSIUM CHLORIDE ER 10 MEQ PO TBCR
10.0000 meq | EXTENDED_RELEASE_TABLET | Freq: Every day | ORAL | 4 refills | Status: DC
  Filled 2023-02-07: qty 90, 90d supply, fill #0

## 2023-02-07 MED FILL — Rosuvastatin Calcium Tab 20 MG: ORAL | 90 days supply | Qty: 90 | Fill #0 | Status: AC

## 2023-02-15 ENCOUNTER — Other Ambulatory Visit (HOSPITAL_COMMUNITY): Payer: Self-pay

## 2023-02-15 MED ORDER — AMOXICILLIN 875 MG PO TABS
875.0000 mg | ORAL_TABLET | Freq: Two times a day (BID) | ORAL | 1 refills | Status: DC
  Filled 2023-02-15: qty 14, 7d supply, fill #0
  Filled 2023-02-20: qty 14, 7d supply, fill #1

## 2023-02-20 ENCOUNTER — Other Ambulatory Visit (HOSPITAL_COMMUNITY): Payer: Self-pay

## 2023-02-21 ENCOUNTER — Other Ambulatory Visit (HOSPITAL_COMMUNITY): Payer: Self-pay

## 2023-04-03 DIAGNOSIS — F330 Major depressive disorder, recurrent, mild: Secondary | ICD-10-CM | POA: Diagnosis not present

## 2023-04-03 DIAGNOSIS — D708 Other neutropenia: Secondary | ICD-10-CM | POA: Diagnosis not present

## 2023-04-03 DIAGNOSIS — Z8546 Personal history of malignant neoplasm of prostate: Secondary | ICD-10-CM | POA: Diagnosis not present

## 2023-04-03 DIAGNOSIS — Z0000 Encounter for general adult medical examination without abnormal findings: Secondary | ICD-10-CM | POA: Diagnosis not present

## 2023-04-03 DIAGNOSIS — U071 COVID-19: Secondary | ICD-10-CM | POA: Diagnosis not present

## 2023-04-03 DIAGNOSIS — Z13220 Encounter for screening for lipoid disorders: Secondary | ICD-10-CM | POA: Diagnosis not present

## 2023-04-03 DIAGNOSIS — Z1389 Encounter for screening for other disorder: Secondary | ICD-10-CM | POA: Diagnosis not present

## 2023-04-03 DIAGNOSIS — R0981 Nasal congestion: Secondary | ICD-10-CM | POA: Diagnosis not present

## 2023-04-03 DIAGNOSIS — F419 Anxiety disorder, unspecified: Secondary | ICD-10-CM | POA: Diagnosis not present

## 2023-04-03 DIAGNOSIS — I872 Venous insufficiency (chronic) (peripheral): Secondary | ICD-10-CM | POA: Diagnosis not present

## 2023-04-03 DIAGNOSIS — I10 Essential (primary) hypertension: Secondary | ICD-10-CM | POA: Diagnosis not present

## 2023-04-03 DIAGNOSIS — Z125 Encounter for screening for malignant neoplasm of prostate: Secondary | ICD-10-CM | POA: Diagnosis not present

## 2023-04-03 DIAGNOSIS — R195 Other fecal abnormalities: Secondary | ICD-10-CM | POA: Diagnosis not present

## 2023-04-07 ENCOUNTER — Other Ambulatory Visit (HOSPITAL_COMMUNITY): Payer: Self-pay

## 2023-04-17 DIAGNOSIS — D696 Thrombocytopenia, unspecified: Secondary | ICD-10-CM | POA: Diagnosis not present

## 2023-04-19 DIAGNOSIS — H524 Presbyopia: Secondary | ICD-10-CM | POA: Diagnosis not present

## 2023-04-25 ENCOUNTER — Other Ambulatory Visit (HOSPITAL_COMMUNITY): Payer: Self-pay

## 2023-04-25 MED ORDER — ESCITALOPRAM OXALATE 20 MG PO TABS
20.0000 mg | ORAL_TABLET | Freq: Every day | ORAL | 3 refills | Status: DC
  Filled 2023-04-25: qty 90, 90d supply, fill #0
  Filled 2023-07-27: qty 90, 90d supply, fill #1
  Filled 2023-10-23: qty 90, 90d supply, fill #2
  Filled 2024-01-19: qty 90, 90d supply, fill #3

## 2023-04-25 MED ORDER — FUROSEMIDE 40 MG PO TABS
40.0000 mg | ORAL_TABLET | Freq: Every day | ORAL | 3 refills | Status: DC
  Filled 2023-04-25: qty 90, 90d supply, fill #0
  Filled 2023-07-27: qty 90, 90d supply, fill #1
  Filled 2023-10-23: qty 90, 90d supply, fill #2
  Filled 2024-01-19: qty 90, 90d supply, fill #3

## 2023-04-28 ENCOUNTER — Other Ambulatory Visit (HOSPITAL_COMMUNITY): Payer: Self-pay

## 2023-05-04 ENCOUNTER — Other Ambulatory Visit (HOSPITAL_COMMUNITY): Payer: Self-pay

## 2023-05-04 MED ORDER — POTASSIUM CHLORIDE ER 10 MEQ PO TBCR
10.0000 meq | EXTENDED_RELEASE_TABLET | Freq: Every day | ORAL | 4 refills | Status: DC
  Filled 2023-05-04: qty 90, 90d supply, fill #0
  Filled 2023-08-09: qty 90, 90d supply, fill #1
  Filled 2023-11-06: qty 90, 90d supply, fill #2
  Filled 2024-02-02: qty 90, 90d supply, fill #3

## 2023-05-04 MED FILL — Rosuvastatin Calcium Tab 20 MG: ORAL | 60 days supply | Qty: 60 | Fill #1 | Status: AC

## 2023-05-05 ENCOUNTER — Other Ambulatory Visit (HOSPITAL_COMMUNITY): Payer: Self-pay

## 2023-05-05 MED ORDER — INFLUENZA VAC A&B SURF ANT ADJ 0.5 ML IM SUSY
0.5000 mL | PREFILLED_SYRINGE | Freq: Once | INTRAMUSCULAR | 0 refills | Status: AC
  Filled 2023-05-05: qty 0.5, 1d supply, fill #0

## 2023-05-05 MED ORDER — COVID-19 MRNA VAC-TRIS(PFIZER) 30 MCG/0.3ML IM SUSY
0.3000 mL | PREFILLED_SYRINGE | Freq: Once | INTRAMUSCULAR | 0 refills | Status: AC
  Filled 2023-05-05: qty 0.3, 1d supply, fill #0

## 2023-05-19 ENCOUNTER — Ambulatory Visit: Payer: BC Managed Care – PPO | Admitting: Internal Medicine

## 2023-05-22 ENCOUNTER — Ambulatory Visit: Payer: BC Managed Care – PPO | Admitting: Cardiology

## 2023-06-06 NOTE — Progress Notes (Unsigned)
  Cardiology Office Note    Patient Name: Brent Massey Date of Encounter: 06/06/2023  Primary Care Provider:  Georgann Housekeeper, MD Primary Cardiologist:  None Primary Electrophysiologist: None   Past Medical History    Past Medical History:  Diagnosis Date   Allergy    Anemia    iron anemia"took oral iron"-years ago   Arthritis    Back pain    lower back "pinched nerve"-being evaluated- numbness left foot toes.   BPH (benign prostatic hyperplasia)    Cancer (HCC)    prostate cancer   Depression    GERD (gastroesophageal reflux disease)    Headache    migraines as teenager   Hypertension    Leukocytopenia    saw oncology first ,now followed by PCP- Dr. Donette Massey.   Neutropenia, unspecified (HCC)    Plantar fascia syndrome    bilateral- intermittent    History of Present Illness  Brent Massey is a 67 y.o. male with a PMH of***   During today's visit the patient reports*** .  Patient denies chest pain, palpitations, dyspnea, PND, orthopnea, nausea, vomiting, dizziness, syncope, edema, weight gain, or early satiety.  ***Notes: -Last ischemic evaluation: -Last echo: -Interim ED visits: Review of Systems  Please see the history of present illness.    All other systems reviewed and are otherwise negative except as noted above.  Physical Exam    Wt Readings from Last 3 Encounters:  08/04/22 214 lb 1.1 oz (97.1 kg)  07/22/22 214 lb (97.1 kg)  05/19/22 213 lb (96.6 kg)   ZO:XWRUE were no vitals filed for this visit.,There is no height or weight on file to calculate BMI. GEN: Well nourished, well developed in no acute distress Neck: No JVD; No carotid bruits Pulmonary: Clear to auscultation without rales, wheezing or rhonchi  Cardiovascular: Normal rate. Regular rhythm. Normal S1. Normal S2.   Murmurs: There is no murmur.  ABDOMEN: Soft, non-tender, non-distended EXTREMITIES:  No edema; No deformity   EKG/LABS/ Recent Cardiac Studies   ECG personally reviewed by me  today - ***  Risk Assessment/Calculations:   {Does this patient have ATRIAL FIBRILLATION?:[phone]}      Lab Results  Component Value Date   WBC 1.7 (L) 07/22/2022   HGB 12.9 (L) 08/05/2022   HCT 40.2 08/05/2022   MCV 99.3 07/22/2022   PLT 266 07/22/2022   Lab Results  Component Value Date   CREATININE 1.07 07/22/2022   BUN 22 07/22/2022   NA 137 07/22/2022   K 3.7 07/22/2022   CL 104 07/22/2022   CO2 30 07/22/2022   No results found for: "CHOL", "HDL", "LDLCALC", "LDLDIRECT", "TRIG", "CHOLHDL"  No results found for: "HGBA1C" Assessment & Plan    1.***  2.***  3.***  4.***      Disposition: Follow-up with None or APP in *** months {Are you ordering a CV Procedure (e.g. stress test, cath, DCCV, TEE, etc)?   Press F2        :[id number]}   Signed, Napoleon Form, Leodis Rains, NP 06/06/2023, 5:05 PM [facility]

## 2023-06-08 ENCOUNTER — Ambulatory Visit (INDEPENDENT_AMBULATORY_CARE_PROVIDER_SITE_OTHER): Payer: Commercial Managed Care - PPO

## 2023-06-08 ENCOUNTER — Encounter: Payer: Self-pay | Admitting: Nurse Practitioner

## 2023-06-08 ENCOUNTER — Telehealth: Payer: Self-pay | Admitting: *Deleted

## 2023-06-08 ENCOUNTER — Ambulatory Visit: Payer: Commercial Managed Care - PPO | Attending: Internal Medicine | Admitting: Nurse Practitioner

## 2023-06-08 ENCOUNTER — Other Ambulatory Visit (HOSPITAL_COMMUNITY): Payer: Self-pay

## 2023-06-08 VITALS — BP 130/78 | HR 60 | Resp 16 | Ht 74.0 in | Wt 198.8 lb

## 2023-06-08 DIAGNOSIS — I872 Venous insufficiency (chronic) (peripheral): Secondary | ICD-10-CM

## 2023-06-08 DIAGNOSIS — R55 Syncope and collapse: Secondary | ICD-10-CM

## 2023-06-08 DIAGNOSIS — E782 Mixed hyperlipidemia: Secondary | ICD-10-CM

## 2023-06-08 DIAGNOSIS — I10 Essential (primary) hypertension: Secondary | ICD-10-CM

## 2023-06-08 DIAGNOSIS — G479 Sleep disorder, unspecified: Secondary | ICD-10-CM | POA: Diagnosis not present

## 2023-06-08 MED ORDER — LOSARTAN POTASSIUM 50 MG PO TABS
50.0000 mg | ORAL_TABLET | Freq: Every day | ORAL | 1 refills | Status: DC
  Filled 2023-06-08: qty 90, 90d supply, fill #0
  Filled 2023-09-03: qty 90, 90d supply, fill #1

## 2023-06-08 NOTE — Progress Notes (Unsigned)
ZIO XT serial # [id number] from office inventory applied to patient.  Patient switching cardiologists.  Assign to Dr. Jacinto Halim to read for now per T. Janee Morn, CMA.

## 2023-06-08 NOTE — Patient Instructions (Signed)
Medication Instructions:  Your physician recommends that you continue on your current medications as directed. Please refer to the Current Medication list given to you today. *If you need a refill on your cardiac medications before your next appointment, please call your pharmacy*   Lab Work: TODAY-BMET, CBC, & TSH If you have labs (blood work) drawn today and your tests are completely normal, you will receive your results only by: MyChart Message (if you have MyChart) OR A paper copy in the mail If you have any lab test that is abnormal or we need to change your treatment, we will call you to review the results.   Testing/Procedures: Brent Massey SLEEP STUDY  ZIO XT- Long Term Monitor Instructions  Your physician has requested you wear a ZIO patch monitor for 14 days.  This is a single patch monitor. [employer] supplies one patch monitor per enrollment. Additional stickers are not available. Please do not apply patch if you will be having a Nuclear Stress Test,  Echocardiogram, Cardiac CT, MRI, or Chest Xray during the period you would be wearing the  monitor. The patch cannot be worn during these tests. You cannot remove and re-apply the  ZIO XT patch monitor.  Your ZIO patch monitor will be mailed 3 day USPS to your address on file. It may take 3-5 days  to receive your monitor after you have been enrolled.  Once you have received your monitor, please review the enclosed instructions. Your monitor  has already been registered assigning a specific monitor serial # to you.  Billing and Patient Assistance Program Information  We have supplied [employer] with any of your insurance information on file for billing purposes. [employer] offers a sliding scale Patient Assistance Program for patients that do not have  insurance, or whose insurance does not completely cover the cost of the ZIO monitor.  You must apply for the Patient Assistance Program to qualify for this discounted rate.  To apply,  please call [employer] at [phone], select option 4, select option 2, ask to apply for  Patient Assistance Program. Brent Massey will ask your household income, and how many people  are in your household. They will quote your out-of-pocket cost based on that information.  [employer] will also be able to set up a 56-month, interest-free payment plan if needed.  Applying the monitor   Shave hair from upper left chest.  Hold abrader disc by orange tab. Rub abrader in 40 strokes over the upper left chest as  indicated in your monitor instructions.  Clean area with 4 enclosed alcohol pads. Let dry.  Apply patch as indicated in monitor instructions. Patch will be placed under collarbone on left  side of chest with arrow pointing upward.  Rub patch adhesive wings for 2 minutes. Remove white label marked "1". Remove the white  label marked "2". Rub patch adhesive wings for 2 additional minutes.  While looking in a mirror, press and release button in center of patch. A small green light will  flash 3-4 times. This will be your only indicator that the monitor has been turned on.  Do not shower for the first 24 hours. You may shower after the first 24 hours.  Press the button if you feel a symptom. You will hear a small click. Record Date, Time and  Symptom in the Patient Logbook.  When you are ready to remove the patch, follow instructions on the last 2 pages of Patient  Logbook. Stick patch monitor onto the last page of Patient  Logbook.  Place Patient Logbook in the blue and white box. Use locking tab on box and tape box closed  securely. The blue and white box has prepaid postage on it. Please place it in the mailbox as  soon as possible. Your physician should have your test results approximately 7 days after the  monitor has been mailed back to [facility].  Call [facility] Customer Care at [phone] if you have questions regarding  your ZIO XT patch monitor. Call them immediately if you see  an orange light blinking on your  monitor.  If your monitor falls off in less than 4 days, contact our Monitor department at [phone].  If your monitor becomes loose or falls off after 4 days call [employer] at [phone] for  suggestions on securing your monitor   Follow-Up: At [facility], you and your health needs are our priority.  As part of our continuing mission to provide you with exceptional heart care, we have created designated Provider Care Teams.  These Care Teams include your primary Cardiologist (physician) and Advanced Practice Providers (APPs -  Physician Assistants and Nurse Practitioners) who all work together to provide you with the care you need, when you need it.  We recommend signing up for the patient portal called "MyChart".  Sign up information is provided on this After Visit Summary.  MyChart is used to connect with patients for Virtual Visits (Telemedicine).  Patients are able to view lab/test results, encounter notes, upcoming appointments, etc.  Non-urgent messages can be sent to your provider as well.   To learn more about what you can do with MyChart, go to [email or web address].    Your next appointment:   2 month(s)  Provider:   Robin Searing, NP     Then, Yates Decamp, MD will plan to see you again in 6 month(s).    Other Instructions

## 2023-06-08 NOTE — Telephone Encounter (Signed)
Robin Searing, NP Shonna Chock;  Patient agreement reviewed and signed on 06/08/2023.  WatchPAT issued to patient on 06/08/2023 by Danielle Rankin, CMA. Patient aware to not open the WatchPAT box until contacted with the activation PIN. Patient profile initialized in CloudPAT on 06/08/2023 by Danielle Rankin, CMA. Device serial number: [id number]  Please list Reason for Call as Advice Only and type "WatchPAT issued to patient" in the comment box.

## 2023-06-09 LAB — CBC
Hematocrit: 42.5 % (ref 37.5–51.0)
Hemoglobin: 13.4 g/dL (ref 13.0–17.7)
MCH: 30.9 pg (ref 26.6–33.0)
MCHC: 31.5 g/dL (ref 31.5–35.7)
MCV: 98 fL — ABNORMAL HIGH (ref 79–97)
Platelets: 145 10*3/uL — ABNORMAL LOW (ref 150–450)
RBC: 4.33 x10E6/uL (ref 4.14–5.80)
RDW: 11.9 % (ref 11.6–15.4)
WBC: 1.5 10*3/uL — CL (ref 3.4–10.8)

## 2023-06-09 LAB — BASIC METABOLIC PANEL
BUN/Creatinine Ratio: 18 (ref 10–24)
BUN: 17 mg/dL (ref 8–27)
CO2: 26 mmol/L (ref 20–29)
Calcium: 8.8 mg/dL (ref 8.6–10.2)
Chloride: 105 mmol/L (ref 96–106)
Creatinine, Ser: 0.92 mg/dL (ref 0.76–1.27)
Glucose: 78 mg/dL (ref 70–99)
Potassium: 4.7 mmol/L (ref 3.5–5.2)
Sodium: 143 mmol/L (ref 134–144)
eGFR: 91 mL/min/{1.73_m2} (ref >59)

## 2023-06-09 LAB — TSH: TSH: 2.43 u[IU]/mL (ref 0.450–4.500)

## 2023-06-26 DIAGNOSIS — Z961 Presence of intraocular lens: Secondary | ICD-10-CM | POA: Diagnosis not present

## 2023-06-26 DIAGNOSIS — H43813 Vitreous degeneration, bilateral: Secondary | ICD-10-CM | POA: Diagnosis not present

## 2023-06-26 DIAGNOSIS — H35033 Hypertensive retinopathy, bilateral: Secondary | ICD-10-CM | POA: Diagnosis not present

## 2023-06-26 DIAGNOSIS — H35371 Puckering of macula, right eye: Secondary | ICD-10-CM | POA: Diagnosis not present

## 2023-06-26 DIAGNOSIS — H31091 Other chorioretinal scars, right eye: Secondary | ICD-10-CM | POA: Diagnosis not present

## 2023-06-27 DIAGNOSIS — I10 Essential (primary) hypertension: Secondary | ICD-10-CM | POA: Diagnosis not present

## 2023-06-27 DIAGNOSIS — R55 Syncope and collapse: Secondary | ICD-10-CM | POA: Diagnosis not present

## 2023-06-27 DIAGNOSIS — R197 Diarrhea, unspecified: Secondary | ICD-10-CM | POA: Diagnosis not present

## 2023-06-29 ENCOUNTER — Telehealth: Payer: Self-pay

## 2023-06-29 DIAGNOSIS — R197 Diarrhea, unspecified: Secondary | ICD-10-CM | POA: Diagnosis not present

## 2023-06-29 DIAGNOSIS — M13841 Other specified arthritis, right hand: Secondary | ICD-10-CM | POA: Diagnosis not present

## 2023-06-29 DIAGNOSIS — M13842 Other specified arthritis, left hand: Secondary | ICD-10-CM | POA: Diagnosis not present

## 2023-06-29 NOTE — Telephone Encounter (Addendum)
**Note De-Identified Brent Massey Obfuscation** Ordering provider: Robin Searing, NP Associated diagnoses: Snoring-R06.83, Sleepiness-G47.10 and Fatigue-R53.83 WatchPAT PA obtained on 06/29/2023 by [name], Lorelle Formosa, LPN. [employer] Call reference #:[id number] Patient notified of PIN (1234) on 06/29/2023 [name] Notification Method: phone.   Phone note routed to covering staff for follow-up.

## 2023-06-29 NOTE — Telephone Encounter (Signed)
Spoke with the patient who states he will be wearing the [employer] or tomorrow. I advised the patient that we would like for him to wear the monitor with in thirty days of approval. He voiced understanding.

## 2023-06-30 ENCOUNTER — Encounter (INDEPENDENT_AMBULATORY_CARE_PROVIDER_SITE_OTHER): Payer: Commercial Managed Care - PPO | Admitting: Cardiology

## 2023-06-30 ENCOUNTER — Other Ambulatory Visit: Payer: Self-pay | Admitting: Internal Medicine

## 2023-06-30 DIAGNOSIS — G4719 Other hypersomnia: Secondary | ICD-10-CM

## 2023-06-30 DIAGNOSIS — R0683 Snoring: Secondary | ICD-10-CM | POA: Diagnosis not present

## 2023-07-05 ENCOUNTER — Other Ambulatory Visit (HOSPITAL_COMMUNITY): Payer: Self-pay

## 2023-07-05 ENCOUNTER — Other Ambulatory Visit: Payer: Self-pay | Admitting: Internal Medicine

## 2023-07-07 ENCOUNTER — Ambulatory Visit: Payer: Commercial Managed Care - PPO | Attending: Nurse Practitioner

## 2023-07-07 DIAGNOSIS — E782 Mixed hyperlipidemia: Secondary | ICD-10-CM

## 2023-07-07 DIAGNOSIS — I872 Venous insufficiency (chronic) (peripheral): Secondary | ICD-10-CM

## 2023-07-07 DIAGNOSIS — I10 Essential (primary) hypertension: Secondary | ICD-10-CM

## 2023-07-07 DIAGNOSIS — R55 Syncope and collapse: Secondary | ICD-10-CM

## 2023-07-07 NOTE — Procedures (Signed)
   SLEEP STUDY REPORT Patient Information Study Date: 06/30/2023 Patient Name: Brent Massey Patient ID: [id number] Birth Date: 11-Aug-1956 Age: 67 Gender: Male BMI: 25.5 (W=198 lb, H=6' 2'') Stopbang: 5 Referring Physician: Robin Searing, NP  TEST DESCRIPTION: Home sleep apnea testing was completed using the WatchPat, a Type 1 device, utilizing  peripheral arterial tonometry (PAT), chest movement, actigraphy, pulse oximetry, pulse rate, body position and snore.  AHI was calculated with apnea and hypopnea using valid sleep time as the denominator. RDI includes apneas,  hypopneas, and RERAs. The data acquired and the scoring of sleep and all associated events were performed in  accordance with the recommended standards and specifications as outlined in the AASM Manual for the Scoring of  Sleep and Associated Events 2.2.0 (2015).   FINDINGS: 1. No evidence of Obstructive Sleep Apnea with AHI 5/hr.  2. No Central Sleep Apnea. 3. Oxygen desaturations as low as 89%. 4. Mild snoring was present. O2 sats were < 88% for 0 minutes. 5. Total sleep time was 5 hrs and 36 min. 6. 11.8% of total sleep time was spent in REM sleep.  7. sleep onset latency at 6 min.  8. REM sleep onset latency at 130 min.  9. Total awakenings were 4.   DIAGNOSIS:  Normal study with no significant sleep disordered breathing.  RECOMMENDATIONS: 1. Normal study with no significant sleep disordered breathing.  2. Healthy sleep recommendations include: adequate nightly sleep (normal 7-9 hrs/night), avoidance of caffeine after  noon and alcohol near bedtime, and maintaining a sleep environment that is cool, dark and quiet. 3. Weight loss for overweight patients is recommended.   4. Snoring recommendations include: weight loss where appropriate, side sleeping, and avoidance of alcohol before  bed.  5. Operation of motor vehicle or dangerous equipment must be avoided when feeling drowsy, excessively sleepy, or   mentally fatigued.   6. An ENT consultation which may be useful for specific causes of and possible treatment of bothersome snoring .   7. Weight loss may be of benefit in reducing the severity of snoring.   Signature: Armanda Magic, MD; [facility]; Diplomat, American Board of Sleep  Medicine Electronically Signed: 07/07/2023 9:11:37 AM

## 2023-07-11 ENCOUNTER — Other Ambulatory Visit (HOSPITAL_COMMUNITY): Payer: Self-pay

## 2023-07-13 ENCOUNTER — Telehealth: Payer: Self-pay | Admitting: Nurse Practitioner

## 2023-07-13 ENCOUNTER — Other Ambulatory Visit: Payer: Self-pay

## 2023-07-13 ENCOUNTER — Other Ambulatory Visit (HOSPITAL_COMMUNITY): Payer: Self-pay

## 2023-07-13 ENCOUNTER — Other Ambulatory Visit: Payer: Self-pay | Admitting: Internal Medicine

## 2023-07-13 MED ORDER — ROSUVASTATIN CALCIUM 20 MG PO TABS
20.0000 mg | ORAL_TABLET | Freq: Every day | ORAL | 3 refills | Status: DC
  Filled 2023-07-13: qty 90, 90d supply, fill #0
  Filled 2023-10-05: qty 90, 90d supply, fill #1
  Filled 2024-01-08: qty 90, 90d supply, fill #2
  Filled 2024-04-04: qty 90, 90d supply, fill #3

## 2023-07-13 NOTE — Telephone Encounter (Signed)
Rx sent to pharmacy   

## 2023-07-13 NOTE — Telephone Encounter (Signed)
*  STAT* If patient is at the pharmacy, call can be transferred to refill team.   1. Which medications need to be refilled? (please list name of each medication and dose if known) rosuvastatin (CRESTOR) 20 MG tablet    4. Which pharmacy/location (including street and city if local pharmacy) is medication to be sent to?  [facility]      5. Do they need a 30 day or 90 day supply? 90

## 2023-07-13 NOTE — Telephone Encounter (Signed)
Pt hasn't had med since Sunday

## 2023-07-14 ENCOUNTER — Telehealth: Payer: Self-pay | Admitting: *Deleted

## 2023-07-14 NOTE — Telephone Encounter (Signed)
-----   Message from Armanda Magic sent at 07/07/2023  9:13 AM EST ----- Please let patient know that sleep study showed no significant sleep apnea.

## 2023-07-14 NOTE — Telephone Encounter (Signed)
The patient has been notified of the result and verbalized understanding.  All questions (if any) were answered. Latrelle Dodrill, CMA 07/14/2023 4:34 PM     Pt is agreeable to normal results.

## 2023-07-28 DIAGNOSIS — C61 Malignant neoplasm of prostate: Secondary | ICD-10-CM | POA: Diagnosis not present

## 2023-07-31 DIAGNOSIS — H31091 Other chorioretinal scars, right eye: Secondary | ICD-10-CM | POA: Diagnosis not present

## 2023-07-31 DIAGNOSIS — H35033 Hypertensive retinopathy, bilateral: Secondary | ICD-10-CM | POA: Diagnosis not present

## 2023-07-31 DIAGNOSIS — H3589 Other specified retinal disorders: Secondary | ICD-10-CM | POA: Diagnosis not present

## 2023-07-31 DIAGNOSIS — H35371 Puckering of macula, right eye: Secondary | ICD-10-CM | POA: Diagnosis not present

## 2023-07-31 DIAGNOSIS — Z961 Presence of intraocular lens: Secondary | ICD-10-CM | POA: Diagnosis not present

## 2023-07-31 DIAGNOSIS — H43813 Vitreous degeneration, bilateral: Secondary | ICD-10-CM | POA: Diagnosis not present

## 2023-08-04 DIAGNOSIS — C61 Malignant neoplasm of prostate: Secondary | ICD-10-CM | POA: Diagnosis not present

## 2023-08-04 DIAGNOSIS — N393 Stress incontinence (female) (male): Secondary | ICD-10-CM | POA: Diagnosis not present

## 2023-08-04 DIAGNOSIS — N5201 Erectile dysfunction due to arterial insufficiency: Secondary | ICD-10-CM | POA: Diagnosis not present

## 2023-08-23 NOTE — Progress Notes (Signed)
 Cardiology Office Note    Patient Name: Brent Massey Date of Encounter: 08/23/2023  Primary Care Provider:  Ransom Other, MD Primary Cardiologist:  None Primary Electrophysiologist: None   Past Medical History    Past Medical History:  Diagnosis Date   Allergy    Anemia    iron anemiatook oral iron-years ago   Arthritis    Back pain    lower back pinched nerve-being evaluated- numbness left foot toes.   BPH (benign prostatic hyperplasia)    Cancer (HCC)    prostate cancer   Depression    GERD (gastroesophageal reflux disease)    Headache    migraines as teenager   Hypertension    Leukocytopenia    saw oncology first ,now followed by PCP- Dr. Husain.   Neutropenia, unspecified (HCC)    Plantar fascia syndrome    bilateral- intermittent    History of Present Illness  Brent Massey is a 68 y.o. male with a PMH of HTN, HLD, family history of CAD, prostate CA s/p prostatectomy, GERD, lower extremity edema, leukocytopenia who presents today for 11-month follow-up.  Brent Massey was seen on 06/08/2023 for evaluation of bradycardia and syncope.  He noted 2 episodes of syncope and collapse and underwent an exercise Myoview that showed no evidence of ischemia.  During follow-up patient reported doing well with occasional episodes of wooziness but no collapse.  He wore a 14-day Zio patch that showed sinus rhythm with occasional PVCs and PACs but no sustained arrhythmia.  He underwent a sleep study that revealed no evidence of sleep apnea or disordered breathing.  Brent Massey  presents for a follow-up visit after a recent episode of near syncope at work. They report no further episodes since the last visit. They have been adhering to their prescribed medication regimen, which includes Lasix , Losartan , Crestor , and a potassium supplement. They report no significant changes in their health status since the last visit and have been generally well. They have been mindful of their diet,  especially during the holiday season. He has a history of elevated cholesterol, which is currently well-managed with Crestor . They also have a history of neutropenia and are under the care of a hematologist. He has a history of swelling in the ankles, which is managed with compression socks and elevation. They are on Lasix  and a potassium supplement for this condition. They report that the swelling has been a long-standing issue.   Patient denies chest pain, palpitations, dyspnea, PND, orthopnea, nausea, vomiting, dizziness, syncope, edema, weight gain, or early satiety.   Review of Systems  Please see the history of present illness.    All other systems reviewed and are otherwise negative except as noted above.  Physical Exam    Wt Readings from Last 3 Encounters:  06/08/23 198 lb 12.8 oz (90.2 kg)  08/04/22 214 lb 1.1 oz (97.1 kg)  07/22/22 214 lb (97.1 kg)   CD:Uyzmz were no vitals filed for this visit.,There is no height or weight on file to calculate BMI. GEN: Well nourished, well developed in no acute distress Neck: No JVD; No carotid bruits Pulmonary: Clear to auscultation without rales, wheezing or rhonchi  Cardiovascular: Normal rate. Regular rhythm. Normal S1. Normal S2.   Murmurs: There is no murmur.  ABDOMEN: Soft, non-tender, non-distended EXTREMITIES:  No edema; No deformity   EKG/LABS/ Recent Cardiac Studies   ECG personally reviewed by me today -none completed today  Risk Assessment/Calculations:      STOP-Bang Score:  5  Lab Results  Component Value Date   WBC 1.5 (LL) 06/08/2023   HGB 13.4 06/08/2023   HCT 42.5 06/08/2023   MCV 98 (H) 06/08/2023   PLT 145 (L) 06/08/2023   Lab Results  Component Value Date   CREATININE 0.92 06/08/2023   BUN 17 06/08/2023   NA 143 06/08/2023   K 4.7 06/08/2023   CL 105 06/08/2023   CO2 26 06/08/2023   No results found for: CHOL, HDL, LDLCALC, LDLDIRECT, TRIG, CHOLHDL  No results found for:  HGBA1C Assessment & Plan    1.  Essential hypertension: -Patient's blood pressure today is well-controlled at 134/70 -Continue losartan  milligrams daily  2.  Hyperlipidemia: -Cholesterol levels well controlled on Crestor . -Continue Crestor .   3.  Syncope and collapse: -No recent episodes. 14-day monitor showed no arrhythmias, pauses, or evidence of sleep apnea. -Continue current management.  4.  Lower extremity edema: -Managed with compression socks and Lasix . -Continue compression socks and Lasix . -Advise to monitor salt intake and elevate legs when dependent.  Disposition: Follow-up with None or APP in 12 months   Signed, Wyn Raddle, Jackee Shove, NP 08/23/2023, 7:53 PM [facility]

## 2023-08-25 ENCOUNTER — Ambulatory Visit: Payer: Commercial Managed Care - PPO | Attending: Nurse Practitioner | Admitting: Nurse Practitioner

## 2023-08-25 ENCOUNTER — Encounter: Payer: Self-pay | Admitting: Nurse Practitioner

## 2023-08-25 VITALS — BP 134/70 | HR 57 | Ht 74.0 in | Wt 202.0 lb

## 2023-08-25 DIAGNOSIS — R600 Localized edema: Secondary | ICD-10-CM | POA: Diagnosis not present

## 2023-08-25 DIAGNOSIS — R55 Syncope and collapse: Secondary | ICD-10-CM

## 2023-08-25 DIAGNOSIS — E782 Mixed hyperlipidemia: Secondary | ICD-10-CM

## 2023-08-25 DIAGNOSIS — I10 Essential (primary) hypertension: Secondary | ICD-10-CM | POA: Diagnosis not present

## 2023-08-25 NOTE — Patient Instructions (Signed)
 Medication Instructions:  Your physician recommends that you continue on your current medications as directed. Please refer to the Current Medication list given to you today.   *If you need a refill on your cardiac medications before your next appointment, please call your pharmacy*   Lab Work: None.  If you have labs (blood work) drawn today and your tests are completely normal, you will receive your results only by: MyChart Message (if you have MyChart) OR A paper copy in the mail If you have any lab test that is abnormal or we need to change your treatment, we will call you to review the results.   Testing/Procedures: None.   Follow-Up: At [facility], you and your health needs are our priority.  As part of our continuing mission to provide you with exceptional heart care, we have created designated Provider Care Teams.  These Care Teams include your primary Cardiologist (physician) and Advanced Practice Providers (APPs -  Physician Assistants and Nurse Practitioners) who all work together to provide you with the care you need, when you need it.  We recommend signing up for the patient portal called MyChart.  Sign up information is provided on this After Visit Summary.  MyChart is used to connect with patients for Virtual Visits (Telemedicine).  Patients are able to view lab/test results, encounter notes, upcoming appointments, etc.  Non-urgent messages can be sent to your provider as well.   To learn more about what you can do with MyChart, go to [email or web address].    Your next appointment:   1 year(s)  Provider:   Dr. DOROTHA Bergamo or Jackee Alberts, NP   Other Instructions Please follow a heart healthy diet.

## 2023-09-07 DIAGNOSIS — M180 Bilateral primary osteoarthritis of first carpometacarpal joints: Secondary | ICD-10-CM | POA: Diagnosis not present

## 2023-09-07 DIAGNOSIS — Z4789 Encounter for other orthopedic aftercare: Secondary | ICD-10-CM | POA: Diagnosis not present

## 2023-10-05 ENCOUNTER — Other Ambulatory Visit (HOSPITAL_COMMUNITY): Payer: Self-pay

## 2023-10-05 DIAGNOSIS — K219 Gastro-esophageal reflux disease without esophagitis: Secondary | ICD-10-CM | POA: Diagnosis not present

## 2023-10-05 DIAGNOSIS — I872 Venous insufficiency (chronic) (peripheral): Secondary | ICD-10-CM | POA: Diagnosis not present

## 2023-10-05 DIAGNOSIS — F331 Major depressive disorder, recurrent, moderate: Secondary | ICD-10-CM | POA: Diagnosis not present

## 2023-10-05 DIAGNOSIS — F419 Anxiety disorder, unspecified: Secondary | ICD-10-CM | POA: Diagnosis not present

## 2023-10-05 DIAGNOSIS — Z8546 Personal history of malignant neoplasm of prostate: Secondary | ICD-10-CM | POA: Diagnosis not present

## 2023-10-05 DIAGNOSIS — R319 Hematuria, unspecified: Secondary | ICD-10-CM | POA: Diagnosis not present

## 2023-10-05 DIAGNOSIS — D72819 Decreased white blood cell count, unspecified: Secondary | ICD-10-CM | POA: Diagnosis not present

## 2023-10-05 DIAGNOSIS — N182 Chronic kidney disease, stage 2 (mild): Secondary | ICD-10-CM | POA: Diagnosis not present

## 2023-10-05 DIAGNOSIS — I10 Essential (primary) hypertension: Secondary | ICD-10-CM | POA: Diagnosis not present

## 2023-10-05 DIAGNOSIS — I700 Atherosclerosis of aorta: Secondary | ICD-10-CM | POA: Diagnosis not present

## 2023-10-05 MED ORDER — CIPROFLOXACIN HCL 500 MG PO TABS
500.0000 mg | ORAL_TABLET | Freq: Two times a day (BID) | ORAL | 0 refills | Status: AC
  Filled 2023-10-05: qty 10, 5d supply, fill #0

## 2023-10-24 DIAGNOSIS — M6281 Muscle weakness (generalized): Secondary | ICD-10-CM | POA: Diagnosis not present

## 2023-10-24 DIAGNOSIS — N393 Stress incontinence (female) (male): Secondary | ICD-10-CM | POA: Diagnosis not present

## 2023-10-24 DIAGNOSIS — M6289 Other specified disorders of muscle: Secondary | ICD-10-CM | POA: Diagnosis not present

## 2023-10-25 DIAGNOSIS — R197 Diarrhea, unspecified: Secondary | ICD-10-CM | POA: Diagnosis not present

## 2023-10-25 DIAGNOSIS — Z1211 Encounter for screening for malignant neoplasm of colon: Secondary | ICD-10-CM | POA: Diagnosis not present

## 2023-11-07 DIAGNOSIS — Z4789 Encounter for other orthopedic aftercare: Secondary | ICD-10-CM | POA: Diagnosis not present

## 2023-11-07 DIAGNOSIS — M1812 Unilateral primary osteoarthritis of first carpometacarpal joint, left hand: Secondary | ICD-10-CM | POA: Diagnosis not present

## 2023-11-10 DIAGNOSIS — N393 Stress incontinence (female) (male): Secondary | ICD-10-CM | POA: Diagnosis not present

## 2023-11-10 DIAGNOSIS — M6281 Muscle weakness (generalized): Secondary | ICD-10-CM | POA: Diagnosis not present

## 2023-11-10 DIAGNOSIS — M6289 Other specified disorders of muscle: Secondary | ICD-10-CM | POA: Diagnosis not present

## 2023-11-15 ENCOUNTER — Ambulatory Visit: Payer: Commercial Managed Care - PPO | Attending: Cardiology | Admitting: Cardiology

## 2023-11-16 ENCOUNTER — Encounter: Payer: Self-pay | Admitting: Cardiology

## 2023-12-04 ENCOUNTER — Other Ambulatory Visit (HOSPITAL_COMMUNITY): Payer: Self-pay

## 2023-12-04 MED ORDER — LOSARTAN POTASSIUM 50 MG PO TABS
50.0000 mg | ORAL_TABLET | Freq: Every day | ORAL | 1 refills | Status: AC
  Filled 2023-12-04: qty 90, 90d supply, fill #0
  Filled 2024-02-26: qty 90, 90d supply, fill #1

## 2023-12-14 DIAGNOSIS — N3943 Post-void dribbling: Secondary | ICD-10-CM | POA: Diagnosis not present

## 2023-12-14 DIAGNOSIS — N393 Stress incontinence (female) (male): Secondary | ICD-10-CM | POA: Diagnosis not present

## 2023-12-14 DIAGNOSIS — M1812 Unilateral primary osteoarthritis of first carpometacarpal joint, left hand: Secondary | ICD-10-CM | POA: Diagnosis not present

## 2023-12-14 DIAGNOSIS — M6281 Muscle weakness (generalized): Secondary | ICD-10-CM | POA: Diagnosis not present

## 2023-12-14 DIAGNOSIS — R351 Nocturia: Secondary | ICD-10-CM | POA: Diagnosis not present

## 2023-12-14 DIAGNOSIS — M6289 Other specified disorders of muscle: Secondary | ICD-10-CM | POA: Diagnosis not present

## 2023-12-14 DIAGNOSIS — M62838 Other muscle spasm: Secondary | ICD-10-CM | POA: Diagnosis not present

## 2024-04-04 ENCOUNTER — Other Ambulatory Visit (HOSPITAL_COMMUNITY): Payer: Self-pay

## 2024-04-05 ENCOUNTER — Other Ambulatory Visit: Payer: Self-pay

## 2024-04-05 ENCOUNTER — Other Ambulatory Visit (HOSPITAL_COMMUNITY): Payer: Self-pay

## 2024-04-05 DIAGNOSIS — M19049 Primary osteoarthritis, unspecified hand: Secondary | ICD-10-CM | POA: Diagnosis not present

## 2024-04-05 DIAGNOSIS — F331 Major depressive disorder, recurrent, moderate: Secondary | ICD-10-CM | POA: Diagnosis not present

## 2024-04-05 DIAGNOSIS — I10 Essential (primary) hypertension: Secondary | ICD-10-CM | POA: Diagnosis not present

## 2024-04-05 DIAGNOSIS — I872 Venous insufficiency (chronic) (peripheral): Secondary | ICD-10-CM | POA: Diagnosis not present

## 2024-04-05 DIAGNOSIS — D708 Other neutropenia: Secondary | ICD-10-CM | POA: Diagnosis not present

## 2024-04-05 DIAGNOSIS — F419 Anxiety disorder, unspecified: Secondary | ICD-10-CM | POA: Diagnosis not present

## 2024-04-05 DIAGNOSIS — N182 Chronic kidney disease, stage 2 (mild): Secondary | ICD-10-CM | POA: Diagnosis not present

## 2024-04-05 DIAGNOSIS — Z8546 Personal history of malignant neoplasm of prostate: Secondary | ICD-10-CM | POA: Diagnosis not present

## 2024-04-05 DIAGNOSIS — Z0000 Encounter for general adult medical examination without abnormal findings: Secondary | ICD-10-CM | POA: Diagnosis not present

## 2024-04-05 DIAGNOSIS — R7309 Other abnormal glucose: Secondary | ICD-10-CM | POA: Diagnosis not present

## 2024-04-05 DIAGNOSIS — I700 Atherosclerosis of aorta: Secondary | ICD-10-CM | POA: Diagnosis not present

## 2024-04-05 MED ORDER — LOSARTAN POTASSIUM 100 MG PO TABS
100.0000 mg | ORAL_TABLET | Freq: Every day | ORAL | 5 refills | Status: AC
  Filled 2024-04-05 – 2024-07-02 (×2): qty 90, 90d supply, fill #0

## 2024-04-18 ENCOUNTER — Other Ambulatory Visit (HOSPITAL_COMMUNITY): Payer: Self-pay

## 2024-04-18 MED ORDER — ESCITALOPRAM OXALATE 20 MG PO TABS
20.0000 mg | ORAL_TABLET | Freq: Every day | ORAL | 3 refills | Status: AC
  Filled 2024-04-18 – 2024-07-03 (×4): qty 90, 90d supply, fill #0

## 2024-04-18 MED ORDER — FUROSEMIDE 40 MG PO TABS
40.0000 mg | ORAL_TABLET | Freq: Every day | ORAL | 3 refills | Status: AC
  Filled 2024-04-18 – 2024-08-01 (×4): qty 90, 90d supply, fill #0

## 2024-05-05 ENCOUNTER — Other Ambulatory Visit (HOSPITAL_COMMUNITY): Payer: Self-pay

## 2024-05-06 ENCOUNTER — Other Ambulatory Visit (HOSPITAL_COMMUNITY): Payer: Self-pay

## 2024-05-06 MED ORDER — POTASSIUM CHLORIDE ER 10 MEQ PO TBCR
10.0000 meq | EXTENDED_RELEASE_TABLET | Freq: Every day | ORAL | 4 refills | Status: AC
  Filled 2024-05-06 – 2024-08-01 (×3): qty 90, 90d supply, fill #0

## 2024-05-17 DIAGNOSIS — C61 Malignant neoplasm of prostate: Secondary | ICD-10-CM | POA: Diagnosis not present

## 2024-05-24 ENCOUNTER — Other Ambulatory Visit (HOSPITAL_COMMUNITY): Payer: Self-pay

## 2024-05-24 DIAGNOSIS — I10 Essential (primary) hypertension: Secondary | ICD-10-CM | POA: Diagnosis not present

## 2024-05-24 DIAGNOSIS — M25511 Pain in right shoulder: Secondary | ICD-10-CM | POA: Diagnosis not present

## 2024-05-24 DIAGNOSIS — C61 Malignant neoplasm of prostate: Secondary | ICD-10-CM | POA: Diagnosis not present

## 2024-05-24 DIAGNOSIS — Z23 Encounter for immunization: Secondary | ICD-10-CM | POA: Diagnosis not present

## 2024-05-24 DIAGNOSIS — N5201 Erectile dysfunction due to arterial insufficiency: Secondary | ICD-10-CM | POA: Diagnosis not present

## 2024-05-24 MED ORDER — SPIRONOLACTONE 25 MG PO TABS
12.5000 mg | ORAL_TABLET | Freq: Every morning | ORAL | 5 refills | Status: AC
  Filled 2024-05-24: qty 30, 60d supply, fill #0

## 2024-06-06 DIAGNOSIS — M7521 Bicipital tendinitis, right shoulder: Secondary | ICD-10-CM | POA: Diagnosis not present

## 2024-06-06 DIAGNOSIS — M7581 Other shoulder lesions, right shoulder: Secondary | ICD-10-CM | POA: Diagnosis not present

## 2024-06-06 DIAGNOSIS — M7541 Impingement syndrome of right shoulder: Secondary | ICD-10-CM | POA: Diagnosis not present

## 2024-06-06 DIAGNOSIS — M25511 Pain in right shoulder: Secondary | ICD-10-CM | POA: Diagnosis not present

## 2024-06-07 DIAGNOSIS — I10 Essential (primary) hypertension: Secondary | ICD-10-CM | POA: Diagnosis not present

## 2024-06-10 ENCOUNTER — Other Ambulatory Visit (HOSPITAL_COMMUNITY): Payer: Self-pay

## 2024-06-11 ENCOUNTER — Other Ambulatory Visit (HOSPITAL_COMMUNITY): Payer: Self-pay

## 2024-06-11 DIAGNOSIS — R399 Unspecified symptoms and signs involving the genitourinary system: Secondary | ICD-10-CM | POA: Diagnosis not present

## 2024-06-26 DIAGNOSIS — M1990 Unspecified osteoarthritis, unspecified site: Secondary | ICD-10-CM | POA: Diagnosis not present

## 2024-06-26 DIAGNOSIS — M799 Soft tissue disorder, unspecified: Secondary | ICD-10-CM | POA: Diagnosis not present

## 2024-06-26 DIAGNOSIS — I10 Essential (primary) hypertension: Secondary | ICD-10-CM | POA: Diagnosis not present

## 2024-06-26 DIAGNOSIS — M6281 Muscle weakness (generalized): Secondary | ICD-10-CM | POA: Diagnosis not present

## 2024-06-26 DIAGNOSIS — M25611 Stiffness of right shoulder, not elsewhere classified: Secondary | ICD-10-CM | POA: Diagnosis not present

## 2024-06-26 DIAGNOSIS — M25511 Pain in right shoulder: Secondary | ICD-10-CM | POA: Diagnosis not present

## 2024-06-28 DIAGNOSIS — M25511 Pain in right shoulder: Secondary | ICD-10-CM | POA: Diagnosis not present

## 2024-06-28 DIAGNOSIS — M6281 Muscle weakness (generalized): Secondary | ICD-10-CM | POA: Diagnosis not present

## 2024-06-28 DIAGNOSIS — M25611 Stiffness of right shoulder, not elsewhere classified: Secondary | ICD-10-CM | POA: Diagnosis not present

## 2024-06-28 DIAGNOSIS — M1990 Unspecified osteoarthritis, unspecified site: Secondary | ICD-10-CM | POA: Diagnosis not present

## 2024-06-28 DIAGNOSIS — I10 Essential (primary) hypertension: Secondary | ICD-10-CM | POA: Diagnosis not present

## 2024-06-28 DIAGNOSIS — M799 Soft tissue disorder, unspecified: Secondary | ICD-10-CM | POA: Diagnosis not present

## 2024-07-02 ENCOUNTER — Other Ambulatory Visit (HOSPITAL_COMMUNITY): Payer: Self-pay

## 2024-07-02 ENCOUNTER — Other Ambulatory Visit: Payer: Self-pay | Admitting: Nurse Practitioner

## 2024-07-02 ENCOUNTER — Other Ambulatory Visit: Payer: Self-pay

## 2024-07-02 DIAGNOSIS — E782 Mixed hyperlipidemia: Secondary | ICD-10-CM

## 2024-07-02 MED ORDER — ROSUVASTATIN CALCIUM 20 MG PO TABS
20.0000 mg | ORAL_TABLET | Freq: Every day | ORAL | 0 refills | Status: AC
  Filled 2024-07-02: qty 90, 90d supply, fill #0

## 2024-07-03 DIAGNOSIS — M1990 Unspecified osteoarthritis, unspecified site: Secondary | ICD-10-CM | POA: Diagnosis not present

## 2024-07-03 DIAGNOSIS — M25511 Pain in right shoulder: Secondary | ICD-10-CM | POA: Diagnosis not present

## 2024-07-03 DIAGNOSIS — M25611 Stiffness of right shoulder, not elsewhere classified: Secondary | ICD-10-CM | POA: Diagnosis not present

## 2024-07-03 DIAGNOSIS — M6281 Muscle weakness (generalized): Secondary | ICD-10-CM | POA: Diagnosis not present

## 2024-07-03 DIAGNOSIS — I10 Essential (primary) hypertension: Secondary | ICD-10-CM | POA: Diagnosis not present

## 2024-07-03 DIAGNOSIS — M799 Soft tissue disorder, unspecified: Secondary | ICD-10-CM | POA: Diagnosis not present

## 2024-07-04 DIAGNOSIS — M25511 Pain in right shoulder: Secondary | ICD-10-CM | POA: Diagnosis not present

## 2024-07-04 DIAGNOSIS — M7521 Bicipital tendinitis, right shoulder: Secondary | ICD-10-CM | POA: Diagnosis not present

## 2024-07-04 DIAGNOSIS — M7581 Other shoulder lesions, right shoulder: Secondary | ICD-10-CM | POA: Diagnosis not present

## 2024-07-04 DIAGNOSIS — M25811 Other specified joint disorders, right shoulder: Secondary | ICD-10-CM | POA: Diagnosis not present

## 2024-07-05 DIAGNOSIS — M25611 Stiffness of right shoulder, not elsewhere classified: Secondary | ICD-10-CM | POA: Diagnosis not present

## 2024-07-05 DIAGNOSIS — M799 Soft tissue disorder, unspecified: Secondary | ICD-10-CM | POA: Diagnosis not present

## 2024-07-05 DIAGNOSIS — M25511 Pain in right shoulder: Secondary | ICD-10-CM | POA: Diagnosis not present

## 2024-07-05 DIAGNOSIS — M6281 Muscle weakness (generalized): Secondary | ICD-10-CM | POA: Diagnosis not present

## 2024-07-05 DIAGNOSIS — M1990 Unspecified osteoarthritis, unspecified site: Secondary | ICD-10-CM | POA: Diagnosis not present

## 2024-07-05 DIAGNOSIS — I10 Essential (primary) hypertension: Secondary | ICD-10-CM | POA: Diagnosis not present

## 2024-07-08 DIAGNOSIS — M1990 Unspecified osteoarthritis, unspecified site: Secondary | ICD-10-CM | POA: Diagnosis not present

## 2024-07-08 DIAGNOSIS — M799 Soft tissue disorder, unspecified: Secondary | ICD-10-CM | POA: Diagnosis not present

## 2024-07-08 DIAGNOSIS — M6281 Muscle weakness (generalized): Secondary | ICD-10-CM | POA: Diagnosis not present

## 2024-07-08 DIAGNOSIS — I10 Essential (primary) hypertension: Secondary | ICD-10-CM | POA: Diagnosis not present

## 2024-07-08 DIAGNOSIS — M25511 Pain in right shoulder: Secondary | ICD-10-CM | POA: Diagnosis not present

## 2024-07-08 DIAGNOSIS — M25611 Stiffness of right shoulder, not elsewhere classified: Secondary | ICD-10-CM | POA: Diagnosis not present

## 2024-07-09 ENCOUNTER — Other Ambulatory Visit (HOSPITAL_COMMUNITY): Payer: Self-pay

## 2024-07-09 DIAGNOSIS — I10 Essential (primary) hypertension: Secondary | ICD-10-CM | POA: Diagnosis not present

## 2024-07-09 DIAGNOSIS — C61 Malignant neoplasm of prostate: Secondary | ICD-10-CM | POA: Diagnosis not present

## 2024-07-09 MED ORDER — SPIRONOLACTONE 25 MG PO TABS
25.0000 mg | ORAL_TABLET | Freq: Every morning | ORAL | 5 refills | Status: AC
  Filled 2024-07-09 – 2024-07-11 (×3): qty 30, 30d supply, fill #0
  Filled 2024-08-04: qty 30, 30d supply, fill #1
  Filled 2024-09-06: qty 30, 30d supply, fill #2

## 2024-07-10 ENCOUNTER — Other Ambulatory Visit (HOSPITAL_COMMUNITY): Payer: Self-pay

## 2024-07-11 ENCOUNTER — Other Ambulatory Visit: Payer: Self-pay

## 2024-07-11 ENCOUNTER — Other Ambulatory Visit (HOSPITAL_COMMUNITY): Payer: Self-pay

## 2024-07-11 DIAGNOSIS — M6281 Muscle weakness (generalized): Secondary | ICD-10-CM | POA: Diagnosis not present

## 2024-07-11 DIAGNOSIS — M1990 Unspecified osteoarthritis, unspecified site: Secondary | ICD-10-CM | POA: Diagnosis not present

## 2024-07-11 DIAGNOSIS — M25611 Stiffness of right shoulder, not elsewhere classified: Secondary | ICD-10-CM | POA: Diagnosis not present

## 2024-07-11 DIAGNOSIS — M799 Soft tissue disorder, unspecified: Secondary | ICD-10-CM | POA: Diagnosis not present

## 2024-07-11 DIAGNOSIS — M25511 Pain in right shoulder: Secondary | ICD-10-CM | POA: Diagnosis not present

## 2024-07-11 DIAGNOSIS — I10 Essential (primary) hypertension: Secondary | ICD-10-CM | POA: Diagnosis not present

## 2024-07-14 ENCOUNTER — Other Ambulatory Visit (HOSPITAL_COMMUNITY): Payer: Self-pay

## 2024-07-15 DIAGNOSIS — M25611 Stiffness of right shoulder, not elsewhere classified: Secondary | ICD-10-CM | POA: Diagnosis not present

## 2024-07-15 DIAGNOSIS — M6281 Muscle weakness (generalized): Secondary | ICD-10-CM | POA: Diagnosis not present

## 2024-07-15 DIAGNOSIS — M25511 Pain in right shoulder: Secondary | ICD-10-CM | POA: Diagnosis not present

## 2024-07-15 DIAGNOSIS — I10 Essential (primary) hypertension: Secondary | ICD-10-CM | POA: Diagnosis not present

## 2024-07-15 DIAGNOSIS — M1990 Unspecified osteoarthritis, unspecified site: Secondary | ICD-10-CM | POA: Diagnosis not present

## 2024-07-15 DIAGNOSIS — M799 Soft tissue disorder, unspecified: Secondary | ICD-10-CM | POA: Diagnosis not present

## 2024-07-22 DIAGNOSIS — R3121 Asymptomatic microscopic hematuria: Secondary | ICD-10-CM | POA: Diagnosis not present

## 2024-07-23 DIAGNOSIS — M25611 Stiffness of right shoulder, not elsewhere classified: Secondary | ICD-10-CM | POA: Diagnosis not present

## 2024-07-23 DIAGNOSIS — M25511 Pain in right shoulder: Secondary | ICD-10-CM | POA: Diagnosis not present

## 2024-07-23 DIAGNOSIS — M1990 Unspecified osteoarthritis, unspecified site: Secondary | ICD-10-CM | POA: Diagnosis not present

## 2024-07-23 DIAGNOSIS — I10 Essential (primary) hypertension: Secondary | ICD-10-CM | POA: Diagnosis not present

## 2024-07-23 DIAGNOSIS — M799 Soft tissue disorder, unspecified: Secondary | ICD-10-CM | POA: Diagnosis not present

## 2024-07-23 DIAGNOSIS — M6281 Muscle weakness (generalized): Secondary | ICD-10-CM | POA: Diagnosis not present

## 2024-07-24 ENCOUNTER — Other Ambulatory Visit (HOSPITAL_COMMUNITY): Payer: Self-pay

## 2024-07-26 DIAGNOSIS — R3121 Asymptomatic microscopic hematuria: Secondary | ICD-10-CM | POA: Diagnosis not present

## 2024-07-29 ENCOUNTER — Other Ambulatory Visit (HOSPITAL_COMMUNITY): Payer: Self-pay

## 2024-07-29 DIAGNOSIS — I10 Essential (primary) hypertension: Secondary | ICD-10-CM | POA: Diagnosis not present

## 2024-07-29 DIAGNOSIS — M25611 Stiffness of right shoulder, not elsewhere classified: Secondary | ICD-10-CM | POA: Diagnosis not present

## 2024-07-29 DIAGNOSIS — M1990 Unspecified osteoarthritis, unspecified site: Secondary | ICD-10-CM | POA: Diagnosis not present

## 2024-07-29 DIAGNOSIS — M6281 Muscle weakness (generalized): Secondary | ICD-10-CM | POA: Diagnosis not present

## 2024-07-29 DIAGNOSIS — M25511 Pain in right shoulder: Secondary | ICD-10-CM | POA: Diagnosis not present

## 2024-07-29 DIAGNOSIS — M799 Soft tissue disorder, unspecified: Secondary | ICD-10-CM | POA: Diagnosis not present

## 2024-07-31 DIAGNOSIS — M6281 Muscle weakness (generalized): Secondary | ICD-10-CM | POA: Diagnosis not present

## 2024-07-31 DIAGNOSIS — M25511 Pain in right shoulder: Secondary | ICD-10-CM | POA: Diagnosis not present

## 2024-07-31 DIAGNOSIS — M1990 Unspecified osteoarthritis, unspecified site: Secondary | ICD-10-CM | POA: Diagnosis not present

## 2024-07-31 DIAGNOSIS — M799 Soft tissue disorder, unspecified: Secondary | ICD-10-CM | POA: Diagnosis not present

## 2024-07-31 DIAGNOSIS — I10 Essential (primary) hypertension: Secondary | ICD-10-CM | POA: Diagnosis not present

## 2024-07-31 DIAGNOSIS — M25611 Stiffness of right shoulder, not elsewhere classified: Secondary | ICD-10-CM | POA: Diagnosis not present

## 2024-08-01 ENCOUNTER — Other Ambulatory Visit (HOSPITAL_COMMUNITY): Payer: Self-pay

## 2024-08-01 ENCOUNTER — Other Ambulatory Visit: Payer: Self-pay

## 2024-08-05 ENCOUNTER — Other Ambulatory Visit (HOSPITAL_COMMUNITY): Payer: Self-pay

## 2024-08-05 DIAGNOSIS — M799 Soft tissue disorder, unspecified: Secondary | ICD-10-CM | POA: Diagnosis not present

## 2024-08-05 DIAGNOSIS — M6281 Muscle weakness (generalized): Secondary | ICD-10-CM | POA: Diagnosis not present

## 2024-08-05 DIAGNOSIS — M1990 Unspecified osteoarthritis, unspecified site: Secondary | ICD-10-CM | POA: Diagnosis not present

## 2024-08-05 DIAGNOSIS — M25611 Stiffness of right shoulder, not elsewhere classified: Secondary | ICD-10-CM | POA: Diagnosis not present

## 2024-08-05 DIAGNOSIS — I10 Essential (primary) hypertension: Secondary | ICD-10-CM | POA: Diagnosis not present

## 2024-08-05 DIAGNOSIS — M25511 Pain in right shoulder: Secondary | ICD-10-CM | POA: Diagnosis not present

## 2024-08-07 DIAGNOSIS — R319 Hematuria, unspecified: Secondary | ICD-10-CM | POA: Diagnosis not present

## 2024-08-08 DIAGNOSIS — M799 Soft tissue disorder, unspecified: Secondary | ICD-10-CM | POA: Diagnosis not present

## 2024-08-08 DIAGNOSIS — M25611 Stiffness of right shoulder, not elsewhere classified: Secondary | ICD-10-CM | POA: Diagnosis not present

## 2024-08-08 DIAGNOSIS — M1990 Unspecified osteoarthritis, unspecified site: Secondary | ICD-10-CM | POA: Diagnosis not present

## 2024-08-08 DIAGNOSIS — I10 Essential (primary) hypertension: Secondary | ICD-10-CM | POA: Diagnosis not present

## 2024-08-08 DIAGNOSIS — M6281 Muscle weakness (generalized): Secondary | ICD-10-CM | POA: Diagnosis not present

## 2024-08-08 DIAGNOSIS — M25511 Pain in right shoulder: Secondary | ICD-10-CM | POA: Diagnosis not present

## 2024-09-03 ENCOUNTER — Other Ambulatory Visit (HOSPITAL_BASED_OUTPATIENT_CLINIC_OR_DEPARTMENT_OTHER): Payer: Self-pay

## 2024-09-06 ENCOUNTER — Other Ambulatory Visit: Payer: Self-pay

## 2024-09-11 ENCOUNTER — Other Ambulatory Visit (HOSPITAL_COMMUNITY): Payer: Self-pay
